# Patient Record
Sex: Male | Born: 1955 | Race: White | Hispanic: No | Smoking: Former smoker
Health system: Southern US, Community
[De-identification: ages and names within clinical notes are randomized; demographics above are authoritative.]

## PROBLEM LIST (undated history)

## (undated) DIAGNOSIS — F32A Depression, unspecified: Secondary | ICD-10-CM

## (undated) DIAGNOSIS — K219 Gastro-esophageal reflux disease without esophagitis: Secondary | ICD-10-CM

## (undated) DIAGNOSIS — H409 Unspecified glaucoma: Secondary | ICD-10-CM

## (undated) DIAGNOSIS — F419 Anxiety disorder, unspecified: Secondary | ICD-10-CM

## (undated) DIAGNOSIS — E785 Hyperlipidemia, unspecified: Secondary | ICD-10-CM

## (undated) DIAGNOSIS — F329 Major depressive disorder, single episode, unspecified: Secondary | ICD-10-CM

## (undated) DIAGNOSIS — I10 Essential (primary) hypertension: Secondary | ICD-10-CM

## (undated) HISTORY — DX: Hyperlipidemia, unspecified: E78.5

## (undated) HISTORY — DX: Anxiety disorder, unspecified: F41.9

## (undated) HISTORY — DX: Depression, unspecified: F32.A

## (undated) HISTORY — DX: Unspecified glaucoma: H40.9

## (undated) HISTORY — PX: SPINE SURGERY: SHX786

## (undated) HISTORY — DX: Gastro-esophageal reflux disease without esophagitis: K21.9

## (undated) HISTORY — DX: Essential (primary) hypertension: I10

## (undated) HISTORY — DX: Major depressive disorder, single episode, unspecified: F32.9

## (undated) HISTORY — PX: EYE SURGERY: SHX253

---

## 1998-03-17 ENCOUNTER — Emergency Department (HOSPITAL_COMMUNITY): Admission: EM | Admit: 1998-03-17 | Discharge: 1998-03-17 | Payer: Self-pay | Admitting: Emergency Medicine

## 2006-09-02 ENCOUNTER — Emergency Department (HOSPITAL_COMMUNITY): Admission: EM | Admit: 2006-09-02 | Discharge: 2006-09-02 | Payer: Self-pay | Admitting: Emergency Medicine

## 2008-07-02 IMAGING — CR DG CHEST 2V
2 series · 2 of 2 positions shown · non-contrast
Comparison: None.

CLINICAL DATA: Chest pain and hypertension.
 CHEST ? 2 VIEW:

[view not recorded (1 of 2)]
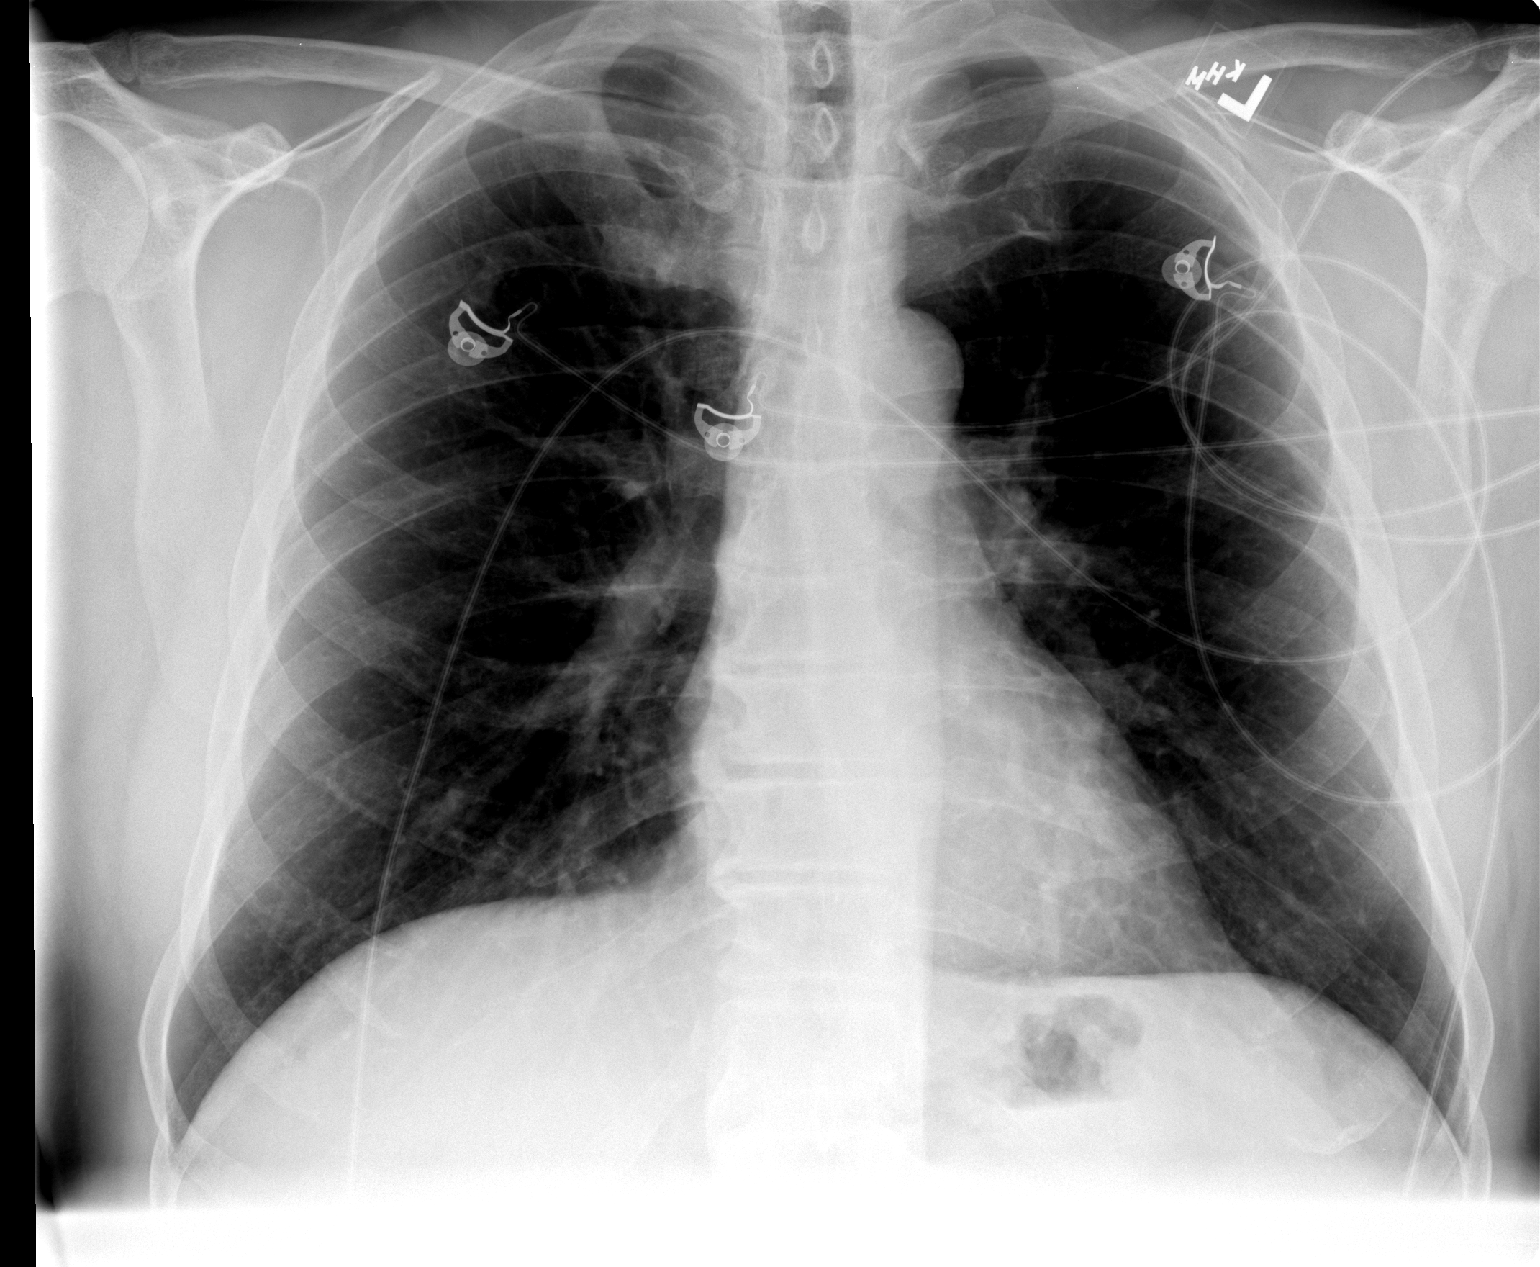

[view not recorded (2 of 2)]
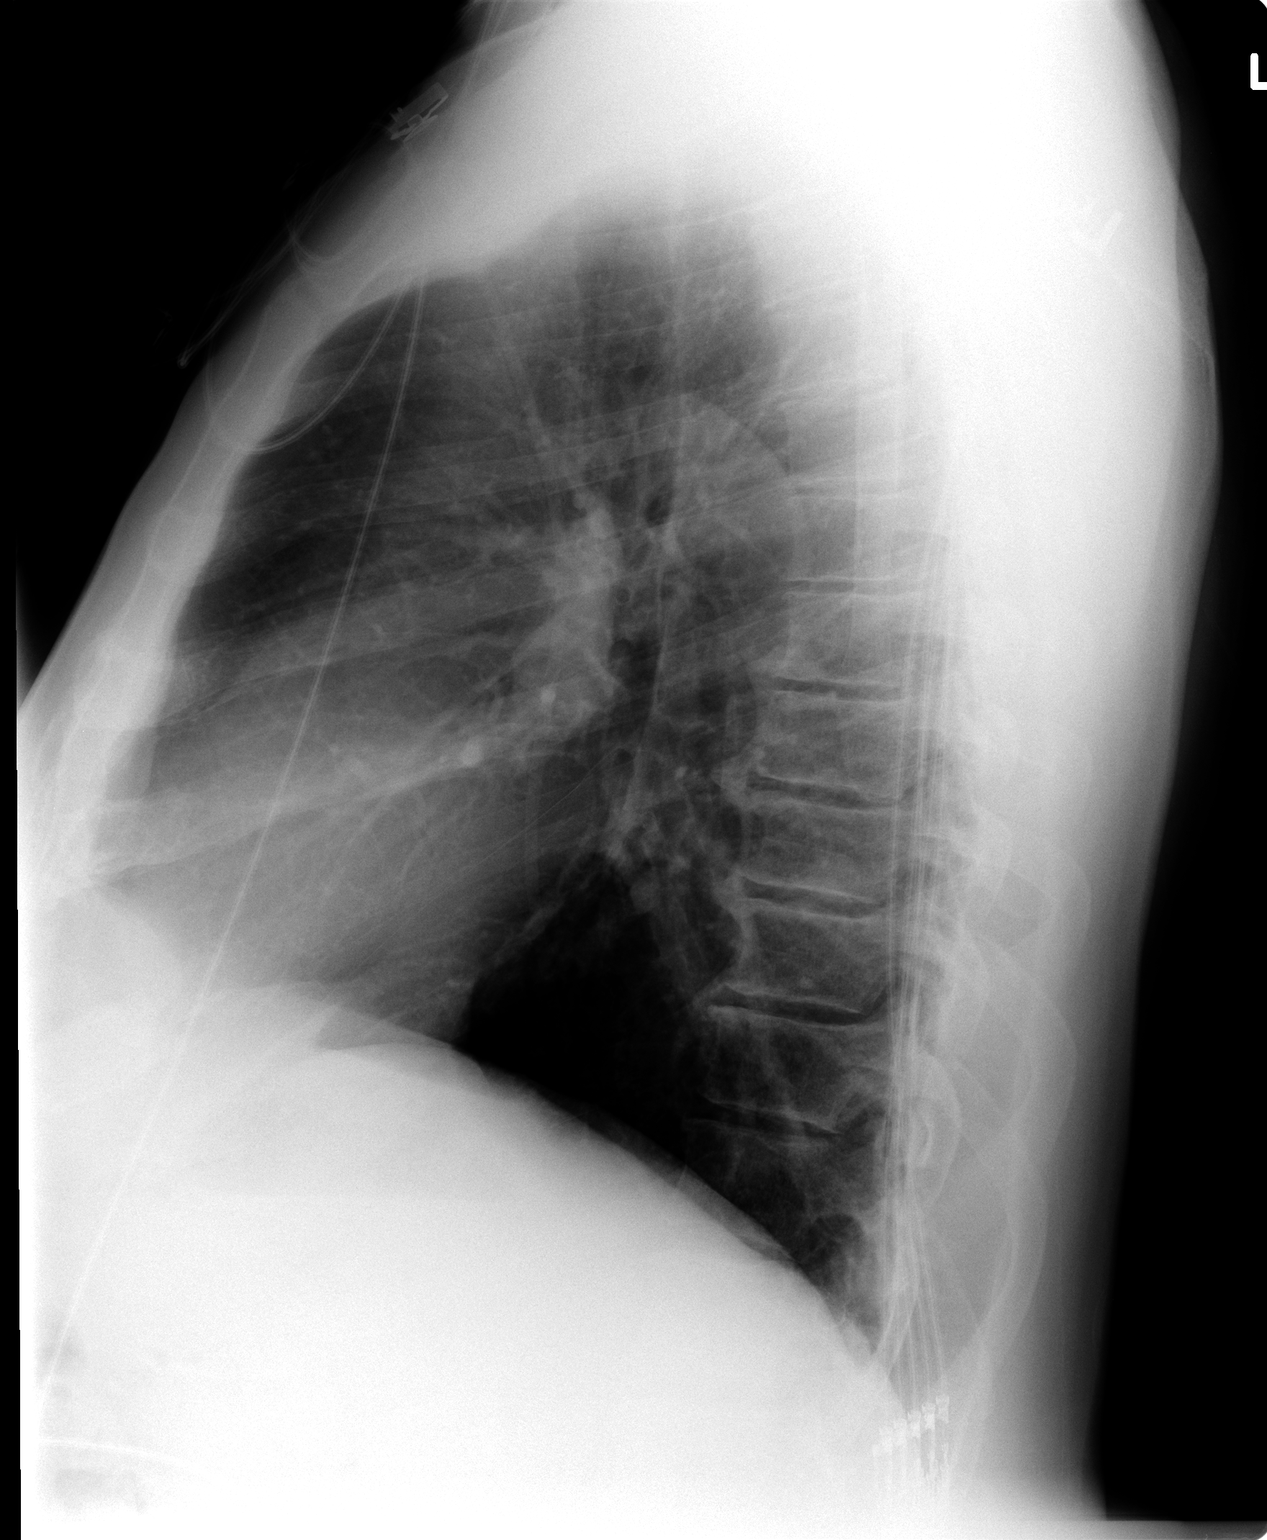

[2 of 2 positions shown; findings below may reference images not displayed]

FINDINGS: Midthoracic spondylosis.  Midline trachea.  Heart size normal.  Mediastinal contours within normal limits.  Costophrenic angles are sharp.  There is no pneumothorax or evidence of congestive failure.  There is density projecting just above and EKG lead below the 5th posterior right rib.  This is likely related to the EKG lead.  The lungs are otherwise clear.
IMPRESSION: 1.  No acute cardiopulmonary disease. 
 2.  Probable artifactual density projecting just above an EKG lead.  Consider followup PA film after removal or reposition of EKG lead to exclude a pulmonary nodule.

## 2015-10-04 DIAGNOSIS — Z139 Encounter for screening, unspecified: Secondary | ICD-10-CM | POA: Diagnosis not present

## 2015-10-04 DIAGNOSIS — Z79899 Other long term (current) drug therapy: Secondary | ICD-10-CM | POA: Diagnosis not present

## 2015-10-04 DIAGNOSIS — F419 Anxiety disorder, unspecified: Secondary | ICD-10-CM | POA: Diagnosis not present

## 2015-10-04 DIAGNOSIS — I10 Essential (primary) hypertension: Secondary | ICD-10-CM | POA: Diagnosis not present

## 2015-10-04 DIAGNOSIS — M25562 Pain in left knee: Secondary | ICD-10-CM | POA: Diagnosis not present

## 2015-10-04 DIAGNOSIS — E785 Hyperlipidemia, unspecified: Secondary | ICD-10-CM | POA: Diagnosis not present

## 2015-10-11 DIAGNOSIS — I10 Essential (primary) hypertension: Secondary | ICD-10-CM | POA: Diagnosis not present

## 2015-10-11 DIAGNOSIS — R7301 Impaired fasting glucose: Secondary | ICD-10-CM | POA: Diagnosis not present

## 2015-10-11 DIAGNOSIS — E785 Hyperlipidemia, unspecified: Secondary | ICD-10-CM | POA: Diagnosis not present

## 2015-10-25 DIAGNOSIS — H2512 Age-related nuclear cataract, left eye: Secondary | ICD-10-CM | POA: Diagnosis not present

## 2015-10-25 DIAGNOSIS — H402223 Chronic angle-closure glaucoma, left eye, severe stage: Secondary | ICD-10-CM | POA: Diagnosis not present

## 2015-10-25 DIAGNOSIS — H25812 Combined forms of age-related cataract, left eye: Secondary | ICD-10-CM | POA: Diagnosis not present

## 2015-10-25 DIAGNOSIS — H401122 Primary open-angle glaucoma, left eye, moderate stage: Secondary | ICD-10-CM | POA: Diagnosis not present

## 2015-11-01 DIAGNOSIS — R799 Abnormal finding of blood chemistry, unspecified: Secondary | ICD-10-CM | POA: Diagnosis not present

## 2015-11-01 DIAGNOSIS — R7301 Impaired fasting glucose: Secondary | ICD-10-CM | POA: Diagnosis not present

## 2015-11-01 DIAGNOSIS — M25562 Pain in left knee: Secondary | ICD-10-CM | POA: Diagnosis not present

## 2015-11-01 DIAGNOSIS — E785 Hyperlipidemia, unspecified: Secondary | ICD-10-CM | POA: Diagnosis not present

## 2015-11-01 DIAGNOSIS — I10 Essential (primary) hypertension: Secondary | ICD-10-CM | POA: Diagnosis not present

## 2015-11-01 DIAGNOSIS — F419 Anxiety disorder, unspecified: Secondary | ICD-10-CM | POA: Diagnosis not present

## 2015-11-08 DIAGNOSIS — I10 Essential (primary) hypertension: Secondary | ICD-10-CM | POA: Diagnosis not present

## 2015-11-08 DIAGNOSIS — E789 Disorder of lipoprotein metabolism, unspecified: Secondary | ICD-10-CM | POA: Diagnosis not present

## 2015-11-08 DIAGNOSIS — F411 Generalized anxiety disorder: Secondary | ICD-10-CM | POA: Diagnosis not present

## 2015-11-15 DIAGNOSIS — H402213 Chronic angle-closure glaucoma, right eye, severe stage: Secondary | ICD-10-CM | POA: Diagnosis not present

## 2015-11-15 DIAGNOSIS — H402223 Chronic angle-closure glaucoma, left eye, severe stage: Secondary | ICD-10-CM | POA: Diagnosis not present

## 2015-11-15 DIAGNOSIS — H2511 Age-related nuclear cataract, right eye: Secondary | ICD-10-CM | POA: Diagnosis not present

## 2015-11-15 DIAGNOSIS — H25811 Combined forms of age-related cataract, right eye: Secondary | ICD-10-CM | POA: Diagnosis not present

## 2015-12-01 DIAGNOSIS — R7301 Impaired fasting glucose: Secondary | ICD-10-CM | POA: Diagnosis not present

## 2015-12-01 DIAGNOSIS — E785 Hyperlipidemia, unspecified: Secondary | ICD-10-CM | POA: Diagnosis not present

## 2015-12-01 DIAGNOSIS — I10 Essential (primary) hypertension: Secondary | ICD-10-CM | POA: Diagnosis not present

## 2015-12-01 DIAGNOSIS — E669 Obesity, unspecified: Secondary | ICD-10-CM | POA: Diagnosis not present

## 2016-03-13 DIAGNOSIS — M25562 Pain in left knee: Secondary | ICD-10-CM | POA: Diagnosis not present

## 2016-03-13 DIAGNOSIS — M25561 Pain in right knee: Secondary | ICD-10-CM | POA: Diagnosis not present

## 2016-03-13 DIAGNOSIS — G8929 Other chronic pain: Secondary | ICD-10-CM | POA: Diagnosis not present

## 2016-03-13 DIAGNOSIS — Z79899 Other long term (current) drug therapy: Secondary | ICD-10-CM | POA: Diagnosis not present

## 2016-03-29 DIAGNOSIS — I10 Essential (primary) hypertension: Secondary | ICD-10-CM | POA: Diagnosis not present

## 2016-03-29 DIAGNOSIS — F419 Anxiety disorder, unspecified: Secondary | ICD-10-CM | POA: Diagnosis not present

## 2016-03-29 DIAGNOSIS — E785 Hyperlipidemia, unspecified: Secondary | ICD-10-CM | POA: Diagnosis not present

## 2016-03-29 DIAGNOSIS — M25562 Pain in left knee: Secondary | ICD-10-CM | POA: Diagnosis not present

## 2016-04-07 DIAGNOSIS — H402212 Chronic angle-closure glaucoma, right eye, moderate stage: Secondary | ICD-10-CM | POA: Diagnosis not present

## 2016-04-07 DIAGNOSIS — H402223 Chronic angle-closure glaucoma, left eye, severe stage: Secondary | ICD-10-CM | POA: Diagnosis not present

## 2016-04-10 DIAGNOSIS — M25562 Pain in left knee: Secondary | ICD-10-CM | POA: Diagnosis not present

## 2016-04-10 DIAGNOSIS — M25561 Pain in right knee: Secondary | ICD-10-CM | POA: Diagnosis not present

## 2016-04-10 DIAGNOSIS — G8929 Other chronic pain: Secondary | ICD-10-CM | POA: Diagnosis not present

## 2016-04-13 DIAGNOSIS — Z23 Encounter for immunization: Secondary | ICD-10-CM | POA: Diagnosis not present

## 2016-05-01 DIAGNOSIS — E785 Hyperlipidemia, unspecified: Secondary | ICD-10-CM | POA: Diagnosis not present

## 2016-05-01 DIAGNOSIS — M25562 Pain in left knee: Secondary | ICD-10-CM | POA: Diagnosis not present

## 2016-05-01 DIAGNOSIS — F419 Anxiety disorder, unspecified: Secondary | ICD-10-CM | POA: Diagnosis not present

## 2016-05-01 DIAGNOSIS — I10 Essential (primary) hypertension: Secondary | ICD-10-CM | POA: Diagnosis not present

## 2016-05-08 DIAGNOSIS — M1712 Unilateral primary osteoarthritis, left knee: Secondary | ICD-10-CM | POA: Diagnosis not present

## 2016-05-10 DIAGNOSIS — M25562 Pain in left knee: Secondary | ICD-10-CM | POA: Diagnosis not present

## 2016-05-10 DIAGNOSIS — M179 Osteoarthritis of knee, unspecified: Secondary | ICD-10-CM | POA: Diagnosis not present

## 2016-05-10 DIAGNOSIS — G8929 Other chronic pain: Secondary | ICD-10-CM | POA: Diagnosis not present

## 2016-05-24 DIAGNOSIS — R7301 Impaired fasting glucose: Secondary | ICD-10-CM | POA: Diagnosis not present

## 2016-05-24 DIAGNOSIS — E785 Hyperlipidemia, unspecified: Secondary | ICD-10-CM | POA: Diagnosis not present

## 2016-05-24 DIAGNOSIS — F419 Anxiety disorder, unspecified: Secondary | ICD-10-CM | POA: Diagnosis not present

## 2016-05-24 DIAGNOSIS — Z79899 Other long term (current) drug therapy: Secondary | ICD-10-CM | POA: Diagnosis not present

## 2016-06-12 DIAGNOSIS — M1712 Unilateral primary osteoarthritis, left knee: Secondary | ICD-10-CM | POA: Diagnosis not present

## 2016-06-12 DIAGNOSIS — M25562 Pain in left knee: Secondary | ICD-10-CM | POA: Diagnosis not present

## 2016-06-19 DIAGNOSIS — M1711 Unilateral primary osteoarthritis, right knee: Secondary | ICD-10-CM | POA: Diagnosis not present

## 2016-07-05 DIAGNOSIS — E785 Hyperlipidemia, unspecified: Secondary | ICD-10-CM | POA: Diagnosis not present

## 2016-07-05 DIAGNOSIS — M25562 Pain in left knee: Secondary | ICD-10-CM | POA: Diagnosis not present

## 2016-07-05 DIAGNOSIS — R7301 Impaired fasting glucose: Secondary | ICD-10-CM | POA: Diagnosis not present

## 2016-07-05 DIAGNOSIS — F419 Anxiety disorder, unspecified: Secondary | ICD-10-CM | POA: Diagnosis not present

## 2016-08-11 DIAGNOSIS — H402212 Chronic angle-closure glaucoma, right eye, moderate stage: Secondary | ICD-10-CM | POA: Diagnosis not present

## 2016-09-04 DIAGNOSIS — F419 Anxiety disorder, unspecified: Secondary | ICD-10-CM | POA: Diagnosis not present

## 2016-09-04 DIAGNOSIS — I10 Essential (primary) hypertension: Secondary | ICD-10-CM | POA: Diagnosis not present

## 2016-09-04 DIAGNOSIS — E785 Hyperlipidemia, unspecified: Secondary | ICD-10-CM | POA: Diagnosis not present

## 2016-09-04 DIAGNOSIS — K219 Gastro-esophageal reflux disease without esophagitis: Secondary | ICD-10-CM | POA: Diagnosis not present

## 2016-09-18 DIAGNOSIS — I10 Essential (primary) hypertension: Secondary | ICD-10-CM | POA: Diagnosis not present

## 2016-09-18 DIAGNOSIS — R110 Nausea: Secondary | ICD-10-CM | POA: Diagnosis not present

## 2016-09-18 DIAGNOSIS — Z79899 Other long term (current) drug therapy: Secondary | ICD-10-CM | POA: Diagnosis not present

## 2016-09-18 DIAGNOSIS — Z139 Encounter for screening, unspecified: Secondary | ICD-10-CM | POA: Diagnosis not present

## 2016-09-18 DIAGNOSIS — F419 Anxiety disorder, unspecified: Secondary | ICD-10-CM | POA: Diagnosis not present

## 2016-10-11 DIAGNOSIS — I10 Essential (primary) hypertension: Secondary | ICD-10-CM | POA: Diagnosis not present

## 2016-10-11 DIAGNOSIS — R110 Nausea: Secondary | ICD-10-CM | POA: Diagnosis not present

## 2016-10-11 DIAGNOSIS — F419 Anxiety disorder, unspecified: Secondary | ICD-10-CM | POA: Diagnosis not present

## 2016-10-11 DIAGNOSIS — K219 Gastro-esophageal reflux disease without esophagitis: Secondary | ICD-10-CM | POA: Diagnosis not present

## 2016-12-04 DIAGNOSIS — F419 Anxiety disorder, unspecified: Secondary | ICD-10-CM | POA: Diagnosis not present

## 2016-12-04 DIAGNOSIS — Z6837 Body mass index (BMI) 37.0-37.9, adult: Secondary | ICD-10-CM | POA: Diagnosis not present

## 2016-12-04 DIAGNOSIS — E785 Hyperlipidemia, unspecified: Secondary | ICD-10-CM | POA: Diagnosis not present

## 2016-12-04 DIAGNOSIS — E669 Obesity, unspecified: Secondary | ICD-10-CM | POA: Diagnosis not present

## 2016-12-04 DIAGNOSIS — Z008 Encounter for other general examination: Secondary | ICD-10-CM | POA: Diagnosis not present

## 2016-12-04 DIAGNOSIS — I10 Essential (primary) hypertension: Secondary | ICD-10-CM | POA: Diagnosis not present

## 2016-12-13 DIAGNOSIS — H402223 Chronic angle-closure glaucoma, left eye, severe stage: Secondary | ICD-10-CM | POA: Diagnosis not present

## 2016-12-13 DIAGNOSIS — H402212 Chronic angle-closure glaucoma, right eye, moderate stage: Secondary | ICD-10-CM | POA: Diagnosis not present

## 2016-12-25 DIAGNOSIS — Z139 Encounter for screening, unspecified: Secondary | ICD-10-CM | POA: Diagnosis not present

## 2016-12-25 DIAGNOSIS — Z79899 Other long term (current) drug therapy: Secondary | ICD-10-CM | POA: Diagnosis not present

## 2016-12-25 DIAGNOSIS — E785 Hyperlipidemia, unspecified: Secondary | ICD-10-CM | POA: Diagnosis not present

## 2017-01-01 ENCOUNTER — Ambulatory Visit (INDEPENDENT_AMBULATORY_CARE_PROVIDER_SITE_OTHER): Payer: BLUE CROSS/BLUE SHIELD | Admitting: Physician Assistant

## 2017-01-01 ENCOUNTER — Encounter: Payer: Self-pay | Admitting: Physician Assistant

## 2017-01-01 VITALS — BP 135/70 | HR 84 | Temp 98.0°F | Ht 71.0 in | Wt 254.4 lb

## 2017-01-01 DIAGNOSIS — M1711 Unilateral primary osteoarthritis, right knee: Secondary | ICD-10-CM | POA: Diagnosis not present

## 2017-01-01 DIAGNOSIS — F419 Anxiety disorder, unspecified: Secondary | ICD-10-CM | POA: Diagnosis not present

## 2017-01-01 DIAGNOSIS — Z719 Counseling, unspecified: Secondary | ICD-10-CM | POA: Diagnosis not present

## 2017-01-01 DIAGNOSIS — Z008 Encounter for other general examination: Secondary | ICD-10-CM | POA: Diagnosis not present

## 2017-01-01 DIAGNOSIS — E7800 Pure hypercholesterolemia, unspecified: Secondary | ICD-10-CM | POA: Diagnosis not present

## 2017-01-01 DIAGNOSIS — H409 Unspecified glaucoma: Secondary | ICD-10-CM

## 2017-01-01 DIAGNOSIS — I10 Essential (primary) hypertension: Secondary | ICD-10-CM | POA: Insufficient documentation

## 2017-01-01 DIAGNOSIS — E785 Hyperlipidemia, unspecified: Secondary | ICD-10-CM | POA: Diagnosis not present

## 2017-01-01 DIAGNOSIS — E669 Obesity, unspecified: Secondary | ICD-10-CM | POA: Diagnosis not present

## 2017-01-01 DIAGNOSIS — Z6836 Body mass index (BMI) 36.0-36.9, adult: Secondary | ICD-10-CM | POA: Diagnosis not present

## 2017-01-01 MED ORDER — ALPRAZOLAM 1 MG PO TABS: 1.0000 mg | ORAL_TABLET | Freq: Two times a day (BID) | ORAL | 5 refills | Status: DC | PRN

## 2017-01-01 NOTE — Patient Instructions (Signed)

## 2017-01-02 ENCOUNTER — Telehealth: Payer: Self-pay | Admitting: Physician Assistant

## 2017-01-02 NOTE — Progress Notes (Signed)
BP 135/70   Pulse 84   Temp 98 F (36.7 C) (Oral)   Ht 5\' 11"  (1.803 m)   Wt 254 lb 6.4 oz (115.4 kg)   BMI 35.48 kg/m    Subjective:    Patient ID: Mark HockeyJerry T Kam, male    DOB: 04/12/1956, 61 y.o.   MRN: [id number]  HPI: Mark Sharp is a 61 y.o. male presenting on 01/01/2017 for Establish Care  This patient comes in To be established with our clinic and for periodic recheck on medications and conditions including osteoarthritis, hypertension anxiety, hyperlipidemia. He also was diagnosed with glaucoma. He had surgery by Dr. Jimmie MollyBing and is doing very well. There were a couple very small cataracts and this was repaired during the surgery time. He has no other significant complaints. His knee bothers him significantly at times. We have discussed getting an injection in it and he is willing to go back and see his orthopedist.  All medications are reviewed today. There are no reports of any problems with the medications. All of the medical conditions are reviewed and updated.  Lab work is reviewed and will be ordered as medically necessary. There are no new problems reported with today's visit.   Relevant past medical, surgical, family and social history reviewed and updated as indicated. Allergies and medications reviewed and updated.  Past Medical History:  Diagnosis Date  . Anxiety   . Depression   . GERD (gastroesophageal reflux disease)   . Glaucoma   . Hyperlipidemia   . Hypertension     Past Surgical History:  Procedure Laterality Date  . EYE SURGERY    . SPINE SURGERY      Review of Systems  Constitutional: Negative.  Negative for appetite change and fatigue.  HENT: Negative.   Eyes: Negative.  Negative for pain and visual disturbance.  Respiratory: Negative.  Negative for cough, chest tightness, shortness of breath and wheezing.   Cardiovascular: Negative.  Negative for chest pain, palpitations and leg swelling.  Gastrointestinal: Negative.  Negative for abdominal pain,  diarrhea, nausea and vomiting.  Endocrine: Negative.   Genitourinary: Negative.   Musculoskeletal: Positive for arthralgias, gait problem and joint swelling.  Skin: Negative.  Negative for color change and rash.  Neurological: Negative for weakness, numbness and headaches.  Psychiatric/Behavioral: Negative.     Allergies as of 01/01/2017      Reactions   Penicillins Rash      Medication List       Accurate as of 01/01/17 11:59 PM. Always use your most recent med list.          ALPRAZolam 1 MG tablet Commonly known as:  XANAX Take 1 tablet (1 mg total) by mouth 2 (two) times daily as needed for anxiety.   citalopram 20 MG tablet Commonly known as:  CELEXA Take 20 mg by mouth daily.   hydrochlorothiazide 25 MG tablet Commonly known as:  HYDRODIURIL Take 25 mg by mouth daily.   pravastatin 40 MG tablet Commonly known as:  PRAVACHOL   ranitidine 150 MG capsule Commonly known as:  ZANTAC Take 150 mg by mouth 2 (two) times daily.   verapamil 180 MG CR tablet Commonly known as:  CALAN-SR Take 180 mg by mouth 3 (three) times daily.          Objective:    BP 135/70   Pulse 84   Temp 98 F (36.7 C) (Oral)   Ht 5\' 11"  (1.803 m)   Wt 254 lb 6.4  oz (115.4 kg)   BMI 35.48 kg/m   Allergies  Allergen Reactions  . Penicillins Rash    Physical Exam  Constitutional: He appears well-developed and well-nourished.  HENT:  Head: Normocephalic and atraumatic.  Eyes: Conjunctivae and EOM are normal. Pupils are equal, round, and reactive to light.  Neck: Normal range of motion. Neck supple.  Cardiovascular: Normal rate, regular rhythm and normal heart sounds.   Pulmonary/Chest: Effort normal and breath sounds normal.  Abdominal: Soft. Bowel sounds are normal.  Musculoskeletal: Normal range of motion. He exhibits edema and deformity.  Skin: Skin is warm and dry.  Nursing note and vitals reviewed.   No results found for this or any previous visit.    Assessment & Plan:    1. Primary osteoarthritis of right knee  2. Essential hypertension - hydrochlorothiazide (HYDRODIURIL) 25 MG tablet; Take 25 mg by mouth daily.  - verapamil (CALAN-SR) 180 MG CR tablet; Take 180 mg by mouth 3 (three) times daily.   3. Anxiety - citalopram (CELEXA) 20 MG tablet; Take 20 mg by mouth daily.  - ALPRAZolam (XANAX) 1 MG tablet; Take 1 tablet (1 mg total) by mouth 2 (two) times daily as needed for anxiety.  Dispense: 60 tablet; Refill: 5  4. Pure hypercholesterolemia - pravastatin (PRAVACHOL) 40 MG tablet;   5. Glaucoma of both eyes, unspecified glaucoma type   Current Outpatient Prescriptions:  .  citalopram (CELEXA) 20 MG tablet, Take 20 mg by mouth daily. , Disp: , Rfl:  .  hydrochlorothiazide (HYDRODIURIL) 25 MG tablet, Take 25 mg by mouth daily. , Disp: , Rfl:  .  pravastatin (PRAVACHOL) 40 MG tablet, , Disp: , Rfl:  .  ranitidine (ZANTAC) 150 MG capsule, Take 150 mg by mouth 2 (two) times daily., Disp: , Rfl:  .  verapamil (CALAN-SR) 180 MG CR tablet, Take 180 mg by mouth 3 (three) times daily. , Disp: , Rfl:  .  ALPRAZolam (XANAX) 1 MG tablet, Take 1 tablet (1 mg total) by mouth 2 (two) times daily as needed for anxiety., Disp: 60 tablet, Rfl: 5  Continue all other maintenance medications as listed above.  Follow up plan: Return in about 6 months (around 07/04/2017) for recheck.  Educational handout given for health maintenance  Remus Loffler PA-C [facility] [address]  [place], [place] [address] [phone]   01/02/2017, 12:56 PM

## 2017-01-18 ENCOUNTER — Encounter: Payer: Self-pay | Admitting: *Deleted

## 2017-03-19 DIAGNOSIS — Z139 Encounter for screening, unspecified: Secondary | ICD-10-CM | POA: Diagnosis not present

## 2017-03-19 DIAGNOSIS — E785 Hyperlipidemia, unspecified: Secondary | ICD-10-CM | POA: Diagnosis not present

## 2017-03-19 DIAGNOSIS — Z79899 Other long term (current) drug therapy: Secondary | ICD-10-CM | POA: Diagnosis not present

## 2017-03-20 DIAGNOSIS — H402212 Chronic angle-closure glaucoma, right eye, moderate stage: Secondary | ICD-10-CM | POA: Diagnosis not present

## 2017-03-20 DIAGNOSIS — H402223 Chronic angle-closure glaucoma, left eye, severe stage: Secondary | ICD-10-CM | POA: Diagnosis not present

## 2017-04-09 DIAGNOSIS — Z6836 Body mass index (BMI) 36.0-36.9, adult: Secondary | ICD-10-CM | POA: Diagnosis not present

## 2017-04-09 DIAGNOSIS — Z008 Encounter for other general examination: Secondary | ICD-10-CM | POA: Diagnosis not present

## 2017-04-09 DIAGNOSIS — E785 Hyperlipidemia, unspecified: Secondary | ICD-10-CM | POA: Diagnosis not present

## 2017-04-09 DIAGNOSIS — F419 Anxiety disorder, unspecified: Secondary | ICD-10-CM | POA: Diagnosis not present

## 2017-04-09 DIAGNOSIS — E669 Obesity, unspecified: Secondary | ICD-10-CM | POA: Diagnosis not present

## 2017-04-09 DIAGNOSIS — I10 Essential (primary) hypertension: Secondary | ICD-10-CM | POA: Diagnosis not present

## 2017-07-04 ENCOUNTER — Ambulatory Visit: Payer: BLUE CROSS/BLUE SHIELD | Admitting: Physician Assistant

## 2017-07-05 ENCOUNTER — Ambulatory Visit: Payer: BLUE CROSS/BLUE SHIELD | Admitting: Physician Assistant

## 2017-07-23 DIAGNOSIS — H402223 Chronic angle-closure glaucoma, left eye, severe stage: Secondary | ICD-10-CM | POA: Diagnosis not present

## 2017-08-20 DIAGNOSIS — H402223 Chronic angle-closure glaucoma, left eye, severe stage: Secondary | ICD-10-CM | POA: Diagnosis not present

## 2017-09-12 DIAGNOSIS — Z139 Encounter for screening, unspecified: Secondary | ICD-10-CM | POA: Diagnosis not present

## 2017-09-12 DIAGNOSIS — R7301 Impaired fasting glucose: Secondary | ICD-10-CM | POA: Diagnosis not present

## 2017-09-12 DIAGNOSIS — Z79899 Other long term (current) drug therapy: Secondary | ICD-10-CM | POA: Diagnosis not present

## 2017-09-12 DIAGNOSIS — E785 Hyperlipidemia, unspecified: Secondary | ICD-10-CM | POA: Diagnosis not present

## 2017-09-26 DIAGNOSIS — E785 Hyperlipidemia, unspecified: Secondary | ICD-10-CM | POA: Diagnosis not present

## 2017-09-26 DIAGNOSIS — I10 Essential (primary) hypertension: Secondary | ICD-10-CM | POA: Diagnosis not present

## 2017-09-26 DIAGNOSIS — F419 Anxiety disorder, unspecified: Secondary | ICD-10-CM | POA: Diagnosis not present

## 2017-09-26 DIAGNOSIS — Z008 Encounter for other general examination: Secondary | ICD-10-CM | POA: Diagnosis not present

## 2017-10-25 ENCOUNTER — Ambulatory Visit: Payer: BLUE CROSS/BLUE SHIELD | Admitting: Physician Assistant

## 2017-10-25 ENCOUNTER — Encounter: Payer: Self-pay | Admitting: Physician Assistant

## 2017-10-25 VITALS — BP 144/89 | HR 94 | Ht 71.0 in | Wt 264.0 lb

## 2017-10-25 DIAGNOSIS — M1711 Unilateral primary osteoarthritis, right knee: Secondary | ICD-10-CM

## 2017-10-25 DIAGNOSIS — K219 Gastro-esophageal reflux disease without esophagitis: Secondary | ICD-10-CM

## 2017-10-25 DIAGNOSIS — F419 Anxiety disorder, unspecified: Secondary | ICD-10-CM

## 2017-10-25 DIAGNOSIS — I10 Essential (primary) hypertension: Secondary | ICD-10-CM

## 2017-10-25 DIAGNOSIS — E7800 Pure hypercholesterolemia, unspecified: Secondary | ICD-10-CM

## 2017-10-25 MED ORDER — HYDROCHLOROTHIAZIDE 25 MG PO TABS: 25.0000 mg | ORAL_TABLET | Freq: Every day | ORAL | 3 refills | Status: DC

## 2017-10-25 MED ORDER — VERAPAMIL HCL ER 180 MG PO TBCR: 180.0000 mg | EXTENDED_RELEASE_TABLET | Freq: Three times a day (TID) | ORAL | 3 refills | Status: DC

## 2017-10-25 MED ORDER — PANTOPRAZOLE SODIUM 20 MG PO TBEC: 20.0000 mg | DELAYED_RELEASE_TABLET | Freq: Every day | ORAL | 3 refills | Status: DC

## 2017-10-25 MED ORDER — PRAVASTATIN SODIUM 40 MG PO TABS: 40.0000 mg | ORAL_TABLET | Freq: Every day | ORAL | 3 refills | Status: DC

## 2017-10-25 MED ORDER — CITALOPRAM HYDROBROMIDE 20 MG PO TABS
20.0000 mg | ORAL_TABLET | Freq: Every day | ORAL | 3 refills | Status: DC
Start: 2017-10-25 — End: 2020-01-21

## 2017-10-25 MED ORDER — DICLOFENAC SODIUM 3 % TD GEL: 1.0000 "application " | Freq: Four times a day (QID) | TRANSDERMAL | 3 refills | Status: DC

## 2017-10-25 MED ORDER — ALPRAZOLAM 1 MG PO TABS: 1.0000 mg | ORAL_TABLET | Freq: Two times a day (BID) | ORAL | 5 refills | Status: DC | PRN

## 2017-10-26 DIAGNOSIS — K219 Gastro-esophageal reflux disease without esophagitis: Secondary | ICD-10-CM | POA: Insufficient documentation

## 2017-10-26 NOTE — Progress Notes (Signed)
BP (!) 144/89   Pulse 94   Ht 5\' 11"  (1.803 m)   Wt 264 lb (119.7 kg)   BMI 36.82 kg/m    Subjective:    Patient ID: Mark Sharp, male    DOB: [birth year], 62 y.o.   MRN: [id number]  HPI: Mark Sharp is a 62 y.o. male presenting on 10/25/2017 for Follow-up and Medication recheck  This patient comes in for a six-month checkup on his medical conditions.  He does see a Advice worker through his work place.  Labs have been sent over.  He did have slight elevation in his triglycerides and glucose.  He is going to work hard on reducing carbs in his diet and plan to have this rechecked at work.  We will recheck him in 6 months.  He does need refills on his medications sent to the mail in pharmacy.  He has no other complaints at this time.  Past Medical History:  Diagnosis Date  . Anxiety   . Depression   . GERD (gastroesophageal reflux disease)   . Glaucoma   . Hyperlipidemia   . Hypertension    Relevant past medical, surgical, family and social history reviewed and updated as indicated. Interim medical history since our last visit reviewed. Allergies and medications reviewed and updated. DATA REVIEWED: CHART IN EPIC  Family History reviewed for pertinent findings.  Review of Systems  Constitutional: Negative.  Negative for appetite change and fatigue.  Eyes: Negative for pain and visual disturbance.  Respiratory: Negative.  Negative for cough, chest tightness, shortness of breath and wheezing.   Cardiovascular: Negative.  Negative for chest pain, palpitations and leg swelling.  Gastrointestinal: Negative.  Negative for abdominal pain, diarrhea, nausea and vomiting.  Genitourinary: Negative.   Musculoskeletal: Positive for arthralgias and joint swelling.  Skin: Negative.  Negative for color change and rash.  Neurological: Negative.  Negative for weakness, numbness and headaches.  Psychiatric/Behavioral: Negative.     Allergies as of 10/25/2017      Reactions   Penicillins Rash      Medication List        Accurate as of 10/25/17 11:59 PM. Always use your most recent med list.          ALPRAZolam 1 MG tablet Commonly known as:  XANAX Take 1 tablet (1 mg total) by mouth 2 (two) times daily as needed for anxiety.   citalopram 20 MG tablet Commonly known as:  CELEXA Take 1 tablet (20 mg total) by mouth daily.   Diclofenac Sodium 3 % Gel Place 1 application onto the skin 4 (four) times daily.   hydrochlorothiazide 25 MG tablet Commonly known as:  HYDRODIURIL Take 1 tablet (25 mg total) by mouth daily.   pantoprazole 20 MG tablet Commonly known as:  PROTONIX Take 1 tablet (20 mg total) by mouth daily.   pravastatin 40 MG tablet Commonly known as:  PRAVACHOL Take 1 tablet (40 mg total) by mouth daily.   verapamil 180 MG CR tablet Commonly known as:  CALAN-SR Take 1 tablet (180 mg total) by mouth 3 (three) times daily.          Objective:    BP (!) 144/89   Pulse 94   Ht 5\' 11"  (1.803 m)   Wt 264 lb (119.7 kg)   BMI 36.82 kg/m   Allergies  Allergen Reactions  . Penicillins Rash    Wt Readings from Last 3 Encounters:  10/25/17 264 lb (119.7 kg)  01/01/17 254 lb 6.4 oz (115.4 kg)    Physical Exam  Constitutional: He appears well-developed and well-nourished. No distress.  HENT:  Head: Normocephalic and atraumatic.  Eyes: Pupils are equal, round, and reactive to light. Conjunctivae and EOM are normal.  Cardiovascular: Normal rate, regular rhythm and normal heart sounds.  Pulmonary/Chest: Effort normal and breath sounds normal. No respiratory distress.  Skin: Skin is warm and dry.  Psychiatric: He has a normal mood and affect. His behavior is normal.  Nursing note and vitals reviewed.   No results found for this or any previous visit.    Assessment & Plan:   1. Gastroesophageal reflux disease without esophagitis - pantoprazole (PROTONIX) 20 MG tablet; Take 1 tablet (20 mg total) by mouth daily.  Dispense: 90  tablet; Refill: 3  2. Anxiety - ALPRAZolam (XANAX) 1 MG tablet; Take 1 tablet (1 mg total) by mouth 2 (two) times daily as needed for anxiety.  Dispense: 60 tablet; Refill: 5 - citalopram (CELEXA) 20 MG tablet; Take 1 tablet (20 mg total) by mouth daily.  Dispense: 90 tablet; Refill: 3  3. Primary osteoarthritis of right knee - Diclofenac Sodium 3 % GEL; Place 1 application onto the skin 4 (four) times daily.  Dispense: 1200 g; Refill: 3  4. Essential hypertension - hydrochlorothiazide (HYDRODIURIL) 25 MG tablet; Take 1 tablet (25 mg total) by mouth daily.  Dispense: 90 tablet; Refill: 3 - verapamil (CALAN-SR) 180 MG CR tablet; Take 1 tablet (180 mg total) by mouth 3 (three) times daily.  Dispense: 270 tablet; Refill: 3  5. Pure hypercholesterolemia - pravastatin (PRAVACHOL) 40 MG tablet; Take 1 tablet (40 mg total) by mouth daily.  Dispense: 90 tablet; Refill: 3   Continue all other maintenance medications as listed above.  Follow up plan: No follow-ups on file.  Educational handout given for survey  Remus LofflerAngel S. Cathie Bonnell PA-C [facility] [address]  [place], [place] [id number] [phone]   10/26/2017, 1:12 PM

## 2017-11-19 DIAGNOSIS — E785 Hyperlipidemia, unspecified: Secondary | ICD-10-CM | POA: Diagnosis not present

## 2017-11-19 DIAGNOSIS — I10 Essential (primary) hypertension: Secondary | ICD-10-CM | POA: Diagnosis not present

## 2017-11-19 DIAGNOSIS — Z008 Encounter for other general examination: Secondary | ICD-10-CM | POA: Diagnosis not present

## 2017-11-19 DIAGNOSIS — F419 Anxiety disorder, unspecified: Secondary | ICD-10-CM | POA: Diagnosis not present

## 2017-12-25 ENCOUNTER — Encounter: Payer: Self-pay | Admitting: *Deleted

## 2018-01-07 DIAGNOSIS — Z6835 Body mass index (BMI) 35.0-35.9, adult: Secondary | ICD-10-CM | POA: Diagnosis not present

## 2018-01-07 DIAGNOSIS — M170 Bilateral primary osteoarthritis of knee: Secondary | ICD-10-CM | POA: Diagnosis not present

## 2018-02-04 DIAGNOSIS — H402212 Chronic angle-closure glaucoma, right eye, moderate stage: Secondary | ICD-10-CM | POA: Diagnosis not present

## 2018-02-25 DIAGNOSIS — Z008 Encounter for other general examination: Secondary | ICD-10-CM | POA: Diagnosis not present

## 2018-02-25 DIAGNOSIS — Z719 Counseling, unspecified: Secondary | ICD-10-CM | POA: Diagnosis not present

## 2018-02-25 DIAGNOSIS — I10 Essential (primary) hypertension: Secondary | ICD-10-CM | POA: Diagnosis not present

## 2018-02-25 DIAGNOSIS — E785 Hyperlipidemia, unspecified: Secondary | ICD-10-CM | POA: Diagnosis not present

## 2018-03-11 ENCOUNTER — Encounter: Payer: Self-pay | Admitting: Physician Assistant

## 2018-03-11 DIAGNOSIS — E785 Hyperlipidemia, unspecified: Secondary | ICD-10-CM | POA: Diagnosis not present

## 2018-03-11 DIAGNOSIS — Z0130 Encounter for examination of blood pressure without abnormal findings: Secondary | ICD-10-CM | POA: Diagnosis not present

## 2018-03-11 DIAGNOSIS — Z79899 Other long term (current) drug therapy: Secondary | ICD-10-CM | POA: Diagnosis not present

## 2018-03-11 DIAGNOSIS — Z139 Encounter for screening, unspecified: Secondary | ICD-10-CM | POA: Diagnosis not present

## 2018-03-11 DIAGNOSIS — R7301 Impaired fasting glucose: Secondary | ICD-10-CM | POA: Diagnosis not present

## 2018-03-25 DIAGNOSIS — I10 Essential (primary) hypertension: Secondary | ICD-10-CM | POA: Diagnosis not present

## 2018-03-25 DIAGNOSIS — R7982 Elevated C-reactive protein (CRP): Secondary | ICD-10-CM | POA: Diagnosis not present

## 2018-03-25 DIAGNOSIS — E785 Hyperlipidemia, unspecified: Secondary | ICD-10-CM | POA: Diagnosis not present

## 2018-03-25 DIAGNOSIS — R7301 Impaired fasting glucose: Secondary | ICD-10-CM | POA: Diagnosis not present

## 2018-03-26 DIAGNOSIS — Z23 Encounter for immunization: Secondary | ICD-10-CM | POA: Diagnosis not present

## 2018-04-22 DIAGNOSIS — I10 Essential (primary) hypertension: Secondary | ICD-10-CM | POA: Diagnosis not present

## 2018-04-22 DIAGNOSIS — Z008 Encounter for other general examination: Secondary | ICD-10-CM | POA: Diagnosis not present

## 2018-04-22 DIAGNOSIS — E785 Hyperlipidemia, unspecified: Secondary | ICD-10-CM | POA: Diagnosis not present

## 2018-04-22 DIAGNOSIS — Z719 Counseling, unspecified: Secondary | ICD-10-CM | POA: Diagnosis not present

## 2018-04-26 ENCOUNTER — Ambulatory Visit: Payer: BLUE CROSS/BLUE SHIELD | Admitting: Physician Assistant

## 2018-04-29 ENCOUNTER — Ambulatory Visit: Payer: BLUE CROSS/BLUE SHIELD | Admitting: Physician Assistant

## 2018-04-29 ENCOUNTER — Encounter: Payer: Self-pay | Admitting: Physician Assistant

## 2018-04-29 VITALS — BP 133/88 | HR 79 | Temp 97.7°F | Ht 71.0 in | Wt 264.0 lb

## 2018-04-29 DIAGNOSIS — M170 Bilateral primary osteoarthritis of knee: Secondary | ICD-10-CM | POA: Diagnosis not present

## 2018-04-29 DIAGNOSIS — I10 Essential (primary) hypertension: Secondary | ICD-10-CM | POA: Diagnosis not present

## 2018-04-29 DIAGNOSIS — F419 Anxiety disorder, unspecified: Secondary | ICD-10-CM | POA: Diagnosis not present

## 2018-04-29 MED ORDER — ALPRAZOLAM 1 MG PO TABS: 1.0000 mg | ORAL_TABLET | Freq: Three times a day (TID) | ORAL | 5 refills | Status: DC | PRN

## 2018-04-30 DIAGNOSIS — M170 Bilateral primary osteoarthritis of knee: Secondary | ICD-10-CM | POA: Insufficient documentation

## 2018-04-30 NOTE — Progress Notes (Signed)
BP 133/88   Pulse 79   Temp 97.7 F (36.5 C) (Oral)   Ht 5\' 11"  (1.803 m)   Wt 264 lb (119.7 kg)   BMI 36.82 kg/m    Subjective:    Patient ID: Mark Sharp, male    DOB: 1956/01/07, 62 y.o.   MRN: [id number]  HPI: DANFORD TAT is a 62 y.o. male presenting on 04/29/2018 for Hypertension (6 month ) and Anxiety  This patient comes in for periodic recheck on medications and conditions including hypertension, OA and anxiety. 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 He has had good results, labs are reviewed. Medications are stable. NEW narcotic contract is reviewed and signed at today's visit. .   All medications are reviewed today. There are no reports of any problems with the medications. All of the medical conditions are reviewed and updated.  Lab work is reviewed and will be ordered as medically necessary. There are no new problems reported with today's visit.   Past Medical History:  Diagnosis Date  . Anxiety   . Depression   . GERD (gastroesophageal reflux disease)   . Glaucoma   . Hyperlipidemia   . Hypertension    Relevant past medical, surgical, family and social history reviewed and updated as indicated. Interim medical history since our last visit reviewed. Allergies and medications reviewed and updated. DATA REVIEWED: CHART IN EPIC  Family History reviewed for pertinent findings.  Review of Systems  Constitutional: Negative.  Negative for appetite change, fatigue and fever.  Eyes: Negative for pain and visual disturbance.  Respiratory: Negative.  Negative for cough, chest tightness, shortness of breath and wheezing.   Cardiovascular: Negative.  Negative for chest pain, palpitations and leg swelling.  Gastrointestinal: Negative.  Negative for abdominal pain, diarrhea, nausea and vomiting.  Genitourinary: Negative.   Musculoskeletal: Positive for arthralgias, gait problem and joint swelling.  Skin: Negative.  Negative for color  change and rash.  Neurological: Negative for weakness, numbness and headaches.  Psychiatric/Behavioral: Negative.     Allergies as of 04/29/2018      Reactions   Penicillins Rash      Medication List        Accurate as of 04/29/18 11:59 PM. Always use your most recent med list.          ALPRAZolam 1 MG tablet Commonly known as:  XANAX Take 1 tablet (1 mg total) by mouth 3 (three) times daily as needed for anxiety.   citalopram 20 MG tablet Commonly known as:  CELEXA Take 1 tablet (20 mg total) by mouth daily.   hydrochlorothiazide 25 MG tablet Commonly known as:  HYDRODIURIL Take 1 tablet (25 mg total) by mouth daily.   latanoprost 0.005 % ophthalmic solution Commonly known as:  XALATAN   pantoprazole 20 MG tablet Commonly known as:  PROTONIX Take 1 tablet (20 mg total) by mouth daily.   pravastatin 40 MG tablet Commonly known as:  PRAVACHOL Take 1 tablet (40 mg total) by mouth daily.   verapamil 180 MG CR tablet Commonly known as:  CALAN-SR Take 1 tablet (180 mg total) by mouth 3 (three) times daily.          Objective:    BP 133/88   Pulse 79   Temp 97.7 F (36.5 C) (Oral)   Ht 5\' 11"  (1.803 m)   Wt 264 lb (119.7 kg)   BMI 36.82 kg/m   Allergies  Allergen Reactions  .  Penicillins Rash    Wt Readings from Last 3 Encounters:  04/29/18 264 lb (119.7 kg)  10/25/17 264 lb (119.7 kg)  01/01/17 254 lb 6.4 oz (115.4 kg)    Physical Exam  Constitutional: He appears well-developed and well-nourished. No distress.  HENT:  Head: Normocephalic and atraumatic.  Eyes: Pupils are equal, round, and reactive to light. Conjunctivae and EOM are normal.  Cardiovascular: Normal rate, regular rhythm and normal heart sounds.  Pulmonary/Chest: Effort normal and breath sounds normal. No respiratory distress.  Skin: Skin is warm and dry.  Psychiatric: He has a normal mood and affect. His behavior is normal.  Nursing note and vitals reviewed.   No results found  for this or any previous visit.    Assessment & Plan:   1. Anxiety - ALPRAZolam (XANAX) 1 MG tablet; Take 1 tablet (1 mg total) by mouth 3 (three) times daily as needed for anxiety.  Dispense: 80 tablet; Refill: 5  2. Primary osteoarthritis of both knees Continue medication  3. Essential hypertension Continue medication  Continue all other maintenance medications as listed above.  Follow up plan: Return in about 6 months (around 10/29/2018) for recheck.  Educational handout given for survey  Remus Loffler PA-C [facility] [address]  [place], [place] [address] [phone]   04/30/2018, 7:27 PM

## 2018-05-27 DIAGNOSIS — E669 Obesity, unspecified: Secondary | ICD-10-CM | POA: Diagnosis not present

## 2018-05-27 DIAGNOSIS — K219 Gastro-esophageal reflux disease without esophagitis: Secondary | ICD-10-CM | POA: Diagnosis not present

## 2018-05-27 DIAGNOSIS — I10 Essential (primary) hypertension: Secondary | ICD-10-CM | POA: Diagnosis not present

## 2018-05-27 DIAGNOSIS — E785 Hyperlipidemia, unspecified: Secondary | ICD-10-CM | POA: Diagnosis not present

## 2018-06-05 DIAGNOSIS — Z6835 Body mass index (BMI) 35.0-35.9, adult: Secondary | ICD-10-CM | POA: Diagnosis not present

## 2018-06-05 DIAGNOSIS — M170 Bilateral primary osteoarthritis of knee: Secondary | ICD-10-CM | POA: Diagnosis not present

## 2018-06-17 ENCOUNTER — Other Ambulatory Visit: Payer: Self-pay | Admitting: Physician Assistant

## 2018-06-17 DIAGNOSIS — F419 Anxiety disorder, unspecified: Secondary | ICD-10-CM

## 2018-06-17 DIAGNOSIS — E669 Obesity, unspecified: Secondary | ICD-10-CM | POA: Diagnosis not present

## 2018-06-17 DIAGNOSIS — Z7689 Persons encountering health services in other specified circumstances: Secondary | ICD-10-CM | POA: Diagnosis not present

## 2018-07-01 ENCOUNTER — Other Ambulatory Visit: Payer: Self-pay | Admitting: Physician Assistant

## 2018-07-01 DIAGNOSIS — F419 Anxiety disorder, unspecified: Secondary | ICD-10-CM

## 2018-07-01 MED ORDER — ALPRAZOLAM 1 MG PO TABS: 1.0000 mg | ORAL_TABLET | Freq: Three times a day (TID) | ORAL | 5 refills | Status: DC | PRN

## 2018-07-01 NOTE — Telephone Encounter (Signed)
Last seen 04/29/18  Lawanna KobusAngel

## 2018-07-01 NOTE — Telephone Encounter (Signed)
Prescription for #80 w/ 5RF given in October.  Please check with patient.  He should not be out of medications.

## 2018-07-08 DIAGNOSIS — R21 Rash and other nonspecific skin eruption: Secondary | ICD-10-CM | POA: Diagnosis not present

## 2018-07-17 DIAGNOSIS — E785 Hyperlipidemia, unspecified: Secondary | ICD-10-CM | POA: Diagnosis not present

## 2018-07-17 DIAGNOSIS — R7982 Elevated C-reactive protein (CRP): Secondary | ICD-10-CM | POA: Diagnosis not present

## 2018-07-17 DIAGNOSIS — Z008 Encounter for other general examination: Secondary | ICD-10-CM | POA: Diagnosis not present

## 2018-07-17 DIAGNOSIS — I10 Essential (primary) hypertension: Secondary | ICD-10-CM | POA: Diagnosis not present

## 2018-08-09 DIAGNOSIS — H402223 Chronic angle-closure glaucoma, left eye, severe stage: Secondary | ICD-10-CM | POA: Diagnosis not present

## 2018-08-14 DIAGNOSIS — R21 Rash and other nonspecific skin eruption: Secondary | ICD-10-CM | POA: Diagnosis not present

## 2018-08-14 DIAGNOSIS — Z6838 Body mass index (BMI) 38.0-38.9, adult: Secondary | ICD-10-CM | POA: Diagnosis not present

## 2018-08-14 DIAGNOSIS — E669 Obesity, unspecified: Secondary | ICD-10-CM | POA: Diagnosis not present

## 2018-08-28 DIAGNOSIS — M170 Bilateral primary osteoarthritis of knee: Secondary | ICD-10-CM | POA: Diagnosis not present

## 2018-09-11 DIAGNOSIS — M170 Bilateral primary osteoarthritis of knee: Secondary | ICD-10-CM | POA: Diagnosis not present

## 2018-09-11 DIAGNOSIS — Z6837 Body mass index (BMI) 37.0-37.9, adult: Secondary | ICD-10-CM | POA: Diagnosis not present

## 2018-09-11 DIAGNOSIS — E669 Obesity, unspecified: Secondary | ICD-10-CM | POA: Diagnosis not present

## 2018-09-11 DIAGNOSIS — Z7689 Persons encountering health services in other specified circumstances: Secondary | ICD-10-CM | POA: Diagnosis not present

## 2018-10-29 ENCOUNTER — Ambulatory Visit (INDEPENDENT_AMBULATORY_CARE_PROVIDER_SITE_OTHER): Payer: BLUE CROSS/BLUE SHIELD | Admitting: Physician Assistant

## 2018-10-29 DIAGNOSIS — F419 Anxiety disorder, unspecified: Secondary | ICD-10-CM | POA: Diagnosis not present

## 2018-10-29 MED ORDER — ALPRAZOLAM 1 MG PO TABS: 1.0000 mg | ORAL_TABLET | Freq: Three times a day (TID) | ORAL | 5 refills | Status: DC | PRN

## 2018-10-30 ENCOUNTER — Encounter: Payer: Self-pay | Admitting: Physician Assistant

## 2018-10-30 NOTE — Progress Notes (Signed)
     Telephone visit  Subjective: CC: Anxiety PCP: Remus Loffler, PA-C [facility] Mark Sharp is a 63 y.o. male calls for telephone consult today. Patient provides verbal consent for consult held via phone.  Patient is identified with 2 separate identifiers.  At this time the entire area is on COVID-19 social distancing and stay home orders are in place.  Patient is of higher risk and therefore we are performing this by a virtual method.  Location of patient: Home Location of provider: [facility] Others present for call: No  This is a periodic recheck for the patient's anxiety.  He has been doing very well on the Celexa.  He does see his provider through work because it is a financial benefit.  They do take care of all of his chronic medications and include citalopram, hydrochlorothiazide, pantoprazole, pravastatin, verapamil.  He states he has had good blood pressure readings.  He still has a lot of problems with his arthritis and knee joints.  He is hoping to retire in the coming year or so.  He needs refills on his alprazolam at this time.  The registry is checked and there are no abnormal behaviors.   ROS: Per HPI  Allergies  Allergen Reactions  . Penicillins Rash   Past Medical History:  Diagnosis Date  . Anxiety   . Depression   . GERD (gastroesophageal reflux disease)   . Glaucoma   . Hyperlipidemia   . Hypertension     Current Outpatient Medications:  .  ALPRAZolam (XANAX) 1 MG tablet, Take 1 tablet (1 mg total) by mouth 3 (three) times daily as needed for anxiety., Disp: 90 tablet, Rfl: 5 .  citalopram (CELEXA) 20 MG tablet, Take 1 tablet (20 mg total) by mouth daily., Disp: 90 tablet, Rfl: 3 .  hydrochlorothiazide (HYDRODIURIL) 25 MG tablet, Take 1 tablet (25 mg total) by mouth daily., Disp: 90 tablet, Rfl: 3 .  latanoprost (XALATAN) 0.005 % ophthalmic solution, , Disp: , Rfl:  .  pantoprazole (PROTONIX) 20 MG tablet, Take 1 tablet (20 mg total) by mouth daily., Disp: 90  tablet, Rfl: 3 .  pravastatin (PRAVACHOL) 40 MG tablet, Take 1 tablet (40 mg total) by mouth daily., Disp: 90 tablet, Rfl: 3 .  verapamil (CALAN-SR) 180 MG CR tablet, Take 1 tablet (180 mg total) by mouth 3 (three) times daily., Disp: 270 tablet, Rfl: 3  Assessment/ Plan: 63 y.o. male   1. Anxiety - ALPRAZolam (XANAX) 1 MG tablet; Take 1 tablet (1 mg total) by mouth 3 (three) times daily as needed for anxiety.  Dispense: 90 tablet; Refill: 5   Start time: 3:10 PM End time: 3:14 PM  Meds ordered this encounter  Medications  . ALPRAZolam (XANAX) 1 MG tablet    Sig: Take 1 tablet (1 mg total) by mouth 3 (three) times daily as needed for anxiety.    Dispense:  90 tablet    Refill:  5    Order Specific Question:   Supervising Provider    Answer:   Raliegh Ip [[id number]]    Prudy Feeler PA-C [facility] [phone]

## 2018-11-14 ENCOUNTER — Telehealth: Payer: Self-pay | Admitting: Physician Assistant

## 2018-11-14 NOTE — Telephone Encounter (Signed)
RX went to [facility]

## 2018-12-23 DIAGNOSIS — E669 Obesity, unspecified: Secondary | ICD-10-CM | POA: Diagnosis not present

## 2018-12-23 DIAGNOSIS — Z6837 Body mass index (BMI) 37.0-37.9, adult: Secondary | ICD-10-CM | POA: Diagnosis not present

## 2018-12-23 DIAGNOSIS — Z7689 Persons encountering health services in other specified circumstances: Secondary | ICD-10-CM | POA: Diagnosis not present

## 2019-01-21 DIAGNOSIS — M170 Bilateral primary osteoarthritis of knee: Secondary | ICD-10-CM | POA: Diagnosis not present

## 2019-02-14 DIAGNOSIS — H402212 Chronic angle-closure glaucoma, right eye, moderate stage: Secondary | ICD-10-CM | POA: Diagnosis not present

## 2019-03-05 DIAGNOSIS — R21 Rash and other nonspecific skin eruption: Secondary | ICD-10-CM | POA: Diagnosis not present

## 2019-03-05 DIAGNOSIS — J3089 Other allergic rhinitis: Secondary | ICD-10-CM | POA: Diagnosis not present

## 2019-03-05 DIAGNOSIS — L299 Pruritus, unspecified: Secondary | ICD-10-CM | POA: Diagnosis not present

## 2019-03-05 DIAGNOSIS — J301 Allergic rhinitis due to pollen: Secondary | ICD-10-CM | POA: Diagnosis not present

## 2019-03-17 DIAGNOSIS — E669 Obesity, unspecified: Secondary | ICD-10-CM | POA: Diagnosis not present

## 2019-03-17 DIAGNOSIS — R21 Rash and other nonspecific skin eruption: Secondary | ICD-10-CM | POA: Diagnosis not present

## 2019-03-17 DIAGNOSIS — Z6837 Body mass index (BMI) 37.0-37.9, adult: Secondary | ICD-10-CM | POA: Diagnosis not present

## 2019-04-02 DIAGNOSIS — R7982 Elevated C-reactive protein (CRP): Secondary | ICD-10-CM | POA: Diagnosis not present

## 2019-04-02 DIAGNOSIS — E785 Hyperlipidemia, unspecified: Secondary | ICD-10-CM | POA: Diagnosis not present

## 2019-04-02 DIAGNOSIS — Z139 Encounter for screening, unspecified: Secondary | ICD-10-CM | POA: Diagnosis not present

## 2019-04-02 DIAGNOSIS — Z79899 Other long term (current) drug therapy: Secondary | ICD-10-CM | POA: Diagnosis not present

## 2019-04-02 DIAGNOSIS — I10 Essential (primary) hypertension: Secondary | ICD-10-CM | POA: Diagnosis not present

## 2019-04-02 DIAGNOSIS — R7301 Impaired fasting glucose: Secondary | ICD-10-CM | POA: Diagnosis not present

## 2019-04-09 DIAGNOSIS — R7982 Elevated C-reactive protein (CRP): Secondary | ICD-10-CM | POA: Diagnosis not present

## 2019-04-09 DIAGNOSIS — E785 Hyperlipidemia, unspecified: Secondary | ICD-10-CM | POA: Diagnosis not present

## 2019-04-09 DIAGNOSIS — R7301 Impaired fasting glucose: Secondary | ICD-10-CM | POA: Diagnosis not present

## 2019-04-14 DIAGNOSIS — Z23 Encounter for immunization: Secondary | ICD-10-CM | POA: Diagnosis not present

## 2019-04-28 DIAGNOSIS — M170 Bilateral primary osteoarthritis of knee: Secondary | ICD-10-CM | POA: Diagnosis not present

## 2019-05-12 DIAGNOSIS — Z6837 Body mass index (BMI) 37.0-37.9, adult: Secondary | ICD-10-CM | POA: Diagnosis not present

## 2019-05-12 DIAGNOSIS — E669 Obesity, unspecified: Secondary | ICD-10-CM | POA: Diagnosis not present

## 2019-05-12 DIAGNOSIS — Z7689 Persons encountering health services in other specified circumstances: Secondary | ICD-10-CM | POA: Diagnosis not present

## 2019-06-02 DIAGNOSIS — Z7689 Persons encountering health services in other specified circumstances: Secondary | ICD-10-CM | POA: Diagnosis not present

## 2019-06-02 DIAGNOSIS — E669 Obesity, unspecified: Secondary | ICD-10-CM | POA: Diagnosis not present

## 2019-06-02 DIAGNOSIS — Z6837 Body mass index (BMI) 37.0-37.9, adult: Secondary | ICD-10-CM | POA: Diagnosis not present

## 2019-06-18 DIAGNOSIS — Z7689 Persons encountering health services in other specified circumstances: Secondary | ICD-10-CM | POA: Diagnosis not present

## 2019-06-18 DIAGNOSIS — E669 Obesity, unspecified: Secondary | ICD-10-CM | POA: Diagnosis not present

## 2019-06-18 DIAGNOSIS — Z6837 Body mass index (BMI) 37.0-37.9, adult: Secondary | ICD-10-CM | POA: Diagnosis not present

## 2019-08-27 DIAGNOSIS — H402212 Chronic angle-closure glaucoma, right eye, moderate stage: Secondary | ICD-10-CM | POA: Diagnosis not present

## 2019-11-13 DIAGNOSIS — M170 Bilateral primary osteoarthritis of knee: Secondary | ICD-10-CM | POA: Diagnosis not present

## 2019-12-25 DIAGNOSIS — M170 Bilateral primary osteoarthritis of knee: Secondary | ICD-10-CM | POA: Diagnosis not present

## 2019-12-30 DIAGNOSIS — M7021 Olecranon bursitis, right elbow: Secondary | ICD-10-CM | POA: Diagnosis not present

## 2020-01-21 ENCOUNTER — Ambulatory Visit (INDEPENDENT_AMBULATORY_CARE_PROVIDER_SITE_OTHER): Payer: Medicare Other | Admitting: Family Medicine

## 2020-01-21 ENCOUNTER — Encounter: Payer: Self-pay | Admitting: Family Medicine

## 2020-01-21 ENCOUNTER — Other Ambulatory Visit: Payer: Self-pay

## 2020-01-21 VITALS — BP 137/82 | HR 86 | Temp 98.3°F | Resp 20 | Ht 71.0 in | Wt 260.4 lb

## 2020-01-21 DIAGNOSIS — Z79899 Other long term (current) drug therapy: Secondary | ICD-10-CM

## 2020-01-21 DIAGNOSIS — E7800 Pure hypercholesterolemia, unspecified: Secondary | ICD-10-CM | POA: Diagnosis not present

## 2020-01-21 DIAGNOSIS — K219 Gastro-esophageal reflux disease without esophagitis: Secondary | ICD-10-CM

## 2020-01-21 DIAGNOSIS — I10 Essential (primary) hypertension: Secondary | ICD-10-CM

## 2020-01-21 DIAGNOSIS — Z125 Encounter for screening for malignant neoplasm of prostate: Secondary | ICD-10-CM

## 2020-01-21 DIAGNOSIS — F419 Anxiety disorder, unspecified: Secondary | ICD-10-CM

## 2020-01-21 MED ORDER — PANTOPRAZOLE SODIUM 20 MG PO TBEC: 20.0000 mg | DELAYED_RELEASE_TABLET | Freq: Every day | ORAL | 3 refills | Status: DC

## 2020-01-21 MED ORDER — HYDROCHLOROTHIAZIDE 25 MG PO TABS: 25.0000 mg | ORAL_TABLET | Freq: Every day | ORAL | 3 refills | Status: DC

## 2020-01-21 MED ORDER — ALPRAZOLAM 1 MG PO TABS: 1.0000 mg | ORAL_TABLET | Freq: Three times a day (TID) | ORAL | 5 refills | Status: DC | PRN

## 2020-01-21 MED ORDER — CITALOPRAM HYDROBROMIDE 20 MG PO TABS: 20.0000 mg | ORAL_TABLET | Freq: Every day | ORAL | 3 refills | Status: DC

## 2020-01-21 MED ORDER — PRAVASTATIN SODIUM 40 MG PO TABS: 40.0000 mg | ORAL_TABLET | Freq: Every day | ORAL | 3 refills | Status: DC

## 2020-01-21 MED ORDER — VERAPAMIL HCL ER 180 MG PO TBCR: 180.0000 mg | EXTENDED_RELEASE_TABLET | Freq: Every day | ORAL | 3 refills | Status: DC

## 2020-01-21 NOTE — Progress Notes (Signed)
Subjective:  Patient ID: Mark Sharp, male    DOB: September 14, 1955  Age: 64 y.o. MRN: [id number]  CC: Medication Refill   HPI Mark Sharp presents for  follow-up of hypertension. Patient has no history of headache chest pain or shortness of breath or recent cough. Patient also denies symptoms of TIA such as focal numbness or weakness. Patient denies side effects from medication. States taking it regularly.  Patient has been taking Xanax for greater than 10 years due to his anxiety.  He takes 1-1/2 to 2 pills daily long-term.  Patient in for follow-up of elevated cholesterol. Doing well without complaints on current medication. Denies side effects of statin including myalgia and arthralgia and nausea. Also in today for liver function testing. Currently no chest pain, shortness of breath or other cardiovascular related symptoms noted.   History Mark Sharp has a past medical history of Anxiety, Depression, GERD (gastroesophageal reflux disease), Glaucoma, Hyperlipidemia, and Hypertension.   He has a past surgical history that includes Eye surgery and Spine surgery.   His family history includes Arthritis in his mother; Cancer in his mother; Diabetes in his mother.He reports that he quit smoking about 31 years ago. He has never used smokeless tobacco. He reports that he does not drink alcohol and does not use drugs.  Current Outpatient Medications on File Prior to Visit  Medication Sig Dispense Refill  . latanoprost (XALATAN) 0.005 % ophthalmic solution      No current facility-administered medications on file prior to visit.    ROS Review of Systems  Constitutional: Negative for fever.  Respiratory: Negative for shortness of breath.   Cardiovascular: Negative for chest pain.  Musculoskeletal: Negative for arthralgias.  Skin: Negative for rash.    Objective:  BP 137/82   Pulse 86   Temp 98.3 F (36.8 C) (Temporal)   Resp 20   Ht 5' 11"  (1.803 m)   Wt 260 lb 6 oz (118.1 kg)   SpO2  96%   BMI 36.31 kg/m   BP Readings from Last 3 Encounters:  01/21/20 137/82  04/29/18 133/88  10/25/17 (!) 144/89    Wt Readings from Last 3 Encounters:  01/21/20 260 lb 6 oz (118.1 kg)  04/29/18 264 lb (119.7 kg)  10/25/17 264 lb (119.7 kg)     Physical Exam Vitals reviewed.  Constitutional:      Appearance: He is well-developed.  HENT:     Head: Normocephalic and atraumatic.     Right Ear: Tympanic membrane and external ear normal. No decreased hearing noted.     Left Ear: Tympanic membrane and external ear normal. No decreased hearing noted.     Mouth/Throat:     Pharynx: No oropharyngeal exudate or posterior oropharyngeal erythema.  Eyes:     Pupils: Pupils are equal, round, and reactive to light.  Cardiovascular:     Rate and Rhythm: Normal rate and regular rhythm.     Heart sounds: No murmur heard.   Pulmonary:     Effort: No respiratory distress.     Breath sounds: Normal breath sounds.  Abdominal:     General: Bowel sounds are normal.     Palpations: Abdomen is soft. There is no mass.     Tenderness: There is no abdominal tenderness.  Musculoskeletal:     Cervical back: Normal range of motion and neck supple.       Assessment & Plan:   [name] was seen today for medication refill.  Diagnoses and all orders for  this visit:  Essential hypertension -     hydrochlorothiazide (HYDRODIURIL) 25 MG tablet; Take 1 tablet (25 mg total) by mouth daily. -     verapamil (CALAN-SR) 180 MG CR tablet; Take 1 tablet (180 mg total) by mouth daily. -     CBC with Differential/Platelet -     CMP14+EGFR -     Lipid panel  Anxiety -     ALPRAZolam (XANAX) 1 MG tablet; Take 1 tablet (1 mg total) by mouth 3 (three) times daily as needed for anxiety. -     citalopram (CELEXA) 20 MG tablet; Take 1 tablet (20 mg total) by mouth daily. -     ToxASSURE Select 13 (MW), Urine  Gastroesophageal reflux disease without esophagitis -     pantoprazole (PROTONIX) 20 MG tablet; Take  1 tablet (20 mg total) by mouth daily. -     CBC with Differential/Platelet -     CMP14+EGFR  Pure hypercholesterolemia -     pravastatin (PRAVACHOL) 40 MG tablet; Take 1 tablet (40 mg total) by mouth daily. -     Lipid panel  Controlled substance agreement signed -     ToxASSURE Select 13 (MW), Urine -     Cancel: ToxASSURE Select 13 (MW), Urine  Screening for prostate cancer -     Cancel: PSA Total (Reflex To Free)  Other orders -     PSA Total (Reflex To Free) -     Specimen status report   Allergies as of 01/21/2020      Reactions   Penicillins Rash      Medication List       Accurate as of January 21, 2020 11:59 PM. If you have any questions, ask your nurse or doctor.        ALPRAZolam 1 MG tablet Commonly known as: Xanax Take 1 tablet (1 mg total) by mouth 3 (three) times daily as needed for anxiety.   citalopram 20 MG tablet Commonly known as: CELEXA Take 1 tablet (20 mg total) by mouth daily.   hydrochlorothiazide 25 MG tablet Commonly known as: HYDRODIURIL Take 1 tablet (25 mg total) by mouth daily.   latanoprost 0.005 % ophthalmic solution Commonly known as: XALATAN   pantoprazole 20 MG tablet Commonly known as: PROTONIX Take 1 tablet (20 mg total) by mouth daily.   pravastatin 40 MG tablet Commonly known as: PRAVACHOL Take 1 tablet (40 mg total) by mouth daily.   verapamil 180 MG CR tablet Commonly known as: CALAN-SR Take 1 tablet (180 mg total) by mouth daily. What changed: when to take this Changed by: Mark Fraise, MD       Meds ordered this encounter  Medications  . ALPRAZolam (XANAX) 1 MG tablet    Sig: Take 1 tablet (1 mg total) by mouth 3 (three) times daily as needed for anxiety.    Dispense:  90 tablet    Refill:  5  . citalopram (CELEXA) 20 MG tablet    Sig: Take 1 tablet (20 mg total) by mouth daily.    Dispense:  90 tablet    Refill:  3  . hydrochlorothiazide (HYDRODIURIL) 25 MG tablet    Sig: Take 1 tablet (25 mg total)  by mouth daily.    Dispense:  90 tablet    Refill:  3  . pantoprazole (PROTONIX) 20 MG tablet    Sig: Take 1 tablet (20 mg total) by mouth daily.    Dispense:  90 tablet    Refill:  3  . pravastatin (PRAVACHOL) 40 MG tablet    Sig: Take 1 tablet (40 mg total) by mouth daily.    Dispense:  90 tablet    Refill:  3  . verapamil (CALAN-SR) 180 MG CR tablet    Sig: Take 1 tablet (180 mg total) by mouth daily.    Dispense:  90 tablet    Refill:  3      Follow-up: Return in about 3 months (around 04/22/2020).  Mark Sharp, M.D.

## 2020-01-22 ENCOUNTER — Telehealth: Payer: Self-pay | Admitting: Family Medicine

## 2020-01-22 LAB — CMP14+EGFR
ALT: 23 IU/L (ref 0–44)
AST: 33 IU/L (ref 0–40)
Albumin/Globulin Ratio: 1.2 (ref 1.2–2.2)
Albumin: 4.2 g/dL (ref 3.8–4.8)
Alkaline Phosphatase: 126 IU/L — ABNORMAL HIGH (ref 48–121)
BUN/Creatinine Ratio: 17 (ref 10–24)
BUN: 16 mg/dL (ref 8–27)
Bilirubin Total: 0.4 mg/dL (ref 0.0–1.2)
CO2: 27 mmol/L (ref 20–29)
Calcium: 9.6 mg/dL (ref 8.6–10.2)
Chloride: 102 mmol/L (ref 96–106)
Creatinine, Ser: 0.93 mg/dL (ref 0.76–1.27)
GFR calc Af Amer: 99 mL/min/{1.73_m2} (ref >59)
GFR calc non Af Amer: 86 mL/min/{1.73_m2} (ref >59)
Globulin, Total: 3.6 g/dL (ref 1.5–4.5)
Glucose: 96 mg/dL (ref 65–99)
Potassium: 3.9 mmol/L (ref 3.5–5.2)
Sodium: 139 mmol/L (ref 134–144)
Total Protein: 7.8 g/dL (ref 6.0–8.5)

## 2020-01-22 LAB — CBC WITH DIFFERENTIAL/PLATELET
Basophils Absolute: 0.1 10*3/uL (ref 0.0–0.2)
Basos: 1 %
EOS (ABSOLUTE): 0.5 10*3/uL — ABNORMAL HIGH (ref 0.0–0.4)
Eos: 5 %
Hematocrit: 38.9 % (ref 37.5–51.0)
Hemoglobin: 12.9 g/dL — ABNORMAL LOW (ref 13.0–17.7)
Immature Grans (Abs): 0.1 10*3/uL (ref 0.0–0.1)
Immature Granulocytes: 1 %
Lymphocytes Absolute: 2.7 10*3/uL (ref 0.7–3.1)
Lymphs: 27 %
MCH: 29.9 pg (ref 26.6–33.0)
MCHC: 33.2 g/dL (ref 31.5–35.7)
MCV: 90 fL (ref 79–97)
Monocytes Absolute: 1.1 10*3/uL — ABNORMAL HIGH (ref 0.1–0.9)
Monocytes: 11 %
Neutrophils Absolute: 5.7 10*3/uL (ref 1.4–7.0)
Neutrophils: 55 %
Platelets: 279 10*3/uL (ref 150–450)
RBC: 4.32 x10E6/uL (ref 4.14–5.80)
RDW: 14.3 % (ref 11.6–15.4)
WBC: 10.1 10*3/uL (ref 3.4–10.8)

## 2020-01-22 LAB — LIPID PANEL
Chol/HDL Ratio: 7 ratio — ABNORMAL HIGH (ref 0.0–5.0)
Cholesterol, Total: 211 mg/dL — ABNORMAL HIGH (ref 100–199)
HDL: 30 mg/dL — ABNORMAL LOW (ref >39)
LDL Chol Calc (NIH): 145 mg/dL — ABNORMAL HIGH (ref 0–99)
Triglycerides: 195 mg/dL — ABNORMAL HIGH (ref 0–149)
VLDL Cholesterol Cal: 36 mg/dL (ref 5–40)

## 2020-01-22 NOTE — Telephone Encounter (Signed)
Pt returned missed call from [facility] regarding lab results. Reviewed results with pt per Dr Darlyn Read notes. Pt says his cholesterol probably has been higher than normal because he ran out of his cholesterol medicine so he hasn't taken since running out.

## 2020-01-22 NOTE — Telephone Encounter (Signed)
Called  And aware to take meds as directed

## 2020-01-23 LAB — PSA TOTAL (REFLEX TO FREE): Prostate Specific Ag, Serum: 0.8 ng/mL (ref 0.0–4.0)

## 2020-01-23 LAB — TOXASSURE SELECT 13 (MW), URINE

## 2020-01-23 LAB — SPECIMEN STATUS REPORT

## 2020-01-26 ENCOUNTER — Encounter: Payer: Self-pay | Admitting: Family Medicine

## 2020-02-05 DIAGNOSIS — M170 Bilateral primary osteoarthritis of knee: Secondary | ICD-10-CM | POA: Diagnosis not present

## 2020-03-11 DIAGNOSIS — H402223 Chronic angle-closure glaucoma, left eye, severe stage: Secondary | ICD-10-CM | POA: Diagnosis not present

## 2020-05-07 DIAGNOSIS — M170 Bilateral primary osteoarthritis of knee: Secondary | ICD-10-CM | POA: Diagnosis not present

## 2020-07-21 ENCOUNTER — Other Ambulatory Visit: Payer: Self-pay | Admitting: Family Medicine

## 2020-07-21 DIAGNOSIS — F419 Anxiety disorder, unspecified: Secondary | ICD-10-CM

## 2020-07-22 ENCOUNTER — Ambulatory Visit: Payer: Medicare Other | Admitting: Family Medicine

## 2020-08-06 DIAGNOSIS — M170 Bilateral primary osteoarthritis of knee: Secondary | ICD-10-CM | POA: Diagnosis not present

## 2020-08-27 DIAGNOSIS — M170 Bilateral primary osteoarthritis of knee: Secondary | ICD-10-CM | POA: Diagnosis not present

## 2020-09-01 ENCOUNTER — Other Ambulatory Visit: Payer: Self-pay

## 2020-09-01 ENCOUNTER — Encounter: Payer: Self-pay | Admitting: Family Medicine

## 2020-09-01 ENCOUNTER — Ambulatory Visit (INDEPENDENT_AMBULATORY_CARE_PROVIDER_SITE_OTHER): Payer: Medicare Other | Admitting: Family Medicine

## 2020-09-01 VITALS — BP 125/74 | HR 89 | Temp 98.6°F | Ht 71.0 in | Wt 269.6 lb

## 2020-09-01 DIAGNOSIS — I10 Essential (primary) hypertension: Secondary | ICD-10-CM | POA: Diagnosis not present

## 2020-09-01 DIAGNOSIS — F419 Anxiety disorder, unspecified: Secondary | ICD-10-CM

## 2020-09-01 DIAGNOSIS — M170 Bilateral primary osteoarthritis of knee: Secondary | ICD-10-CM | POA: Diagnosis not present

## 2020-09-01 DIAGNOSIS — Z23 Encounter for immunization: Secondary | ICD-10-CM | POA: Diagnosis not present

## 2020-09-01 DIAGNOSIS — Z1159 Encounter for screening for other viral diseases: Secondary | ICD-10-CM

## 2020-09-01 DIAGNOSIS — E7800 Pure hypercholesterolemia, unspecified: Secondary | ICD-10-CM | POA: Diagnosis not present

## 2020-09-01 LAB — CMP14+EGFR

## 2020-09-01 LAB — LIPID PANEL

## 2020-09-01 LAB — CBC WITH DIFFERENTIAL/PLATELET
Basos: 1 %
Immature Granulocytes: 3 %
Lymphocytes Absolute: 2.2 10*3/uL (ref 0.7–3.1)
Monocytes: 9 %
Platelets: 262 10*3/uL (ref 150–450)
WBC: 8.1 10*3/uL (ref 3.4–10.8)

## 2020-09-01 LAB — HEPATITIS C ANTIBODY

## 2020-09-01 MED ORDER — ALPRAZOLAM 1 MG PO TABS
1.0000 mg | ORAL_TABLET | Freq: Three times a day (TID) | ORAL | 5 refills | Status: DC | PRN
Start: 2020-09-01 — End: 2021-01-10

## 2020-09-01 MED ORDER — TRULICITY 0.75 MG/0.5ML ~~LOC~~ SOAJ: 0.7500 mg | SUBCUTANEOUS | 2 refills | Status: AC

## 2020-09-01 NOTE — Progress Notes (Signed)
Subjective:  Patient ID: Mark Sharp, male    DOB: 31-Oct-1955  Age: 65 y.o. MRN: [id number]  CC: Hypertension   HPI Mark Sharp presents for  follow-up of hypertension. Patient has no history of headache chest pain or shortness of breath or recent cough. Patient also denies symptoms of TIA such as focal numbness or weakness. Patient denies side effects from medication. States taking it regularly.  Pt. Tried to cut back on xanax and got very jittery, nervous. Had to go back to three a day. Has panic disorder that responds to xanax within 15 minutes. Occur about twice a week.    in for follow-up of elevated cholesterol. Doing well without complaints on current medication. Denies side effects of statin including myalgia and arthralgia and nausea. Currently no chest pain, shortness of breath or other cardiovascular related symptoms noted.    History Mark Sharp has a past medical history of Anxiety, Depression, GERD (gastroesophageal reflux disease), Glaucoma, Hyperlipidemia, and Hypertension.   Mark Sharp has a past surgical history that includes Eye surgery and Spine surgery.   His family history includes Arthritis in his mother; Cancer in his mother; Diabetes in his mother.Mark Sharp reports that Mark Sharp quit smoking about 32 years ago. Mark Sharp has never used smokeless tobacco. Mark Sharp reports that Mark Sharp does not drink alcohol and does not use drugs.  Current Outpatient Medications on File Prior to Visit  Medication Sig Dispense Refill  . citalopram (CELEXA) 20 MG tablet Take 1 tablet (20 mg total) by mouth daily. 90 tablet 3  . hydrochlorothiazide (HYDRODIURIL) 25 MG tablet Take 1 tablet (25 mg total) by mouth daily. 90 tablet 3  . latanoprost (XALATAN) 0.005 % ophthalmic solution     . pantoprazole (PROTONIX) 20 MG tablet Take 1 tablet (20 mg total) by mouth daily. 90 tablet 3  . pravastatin (PRAVACHOL) 40 MG tablet Take 1 tablet (40 mg total) by mouth daily. 90 tablet 3  . verapamil (CALAN-SR) 180 MG CR tablet Take 1  tablet (180 mg total) by mouth daily. 90 tablet 3   No current facility-administered medications on file prior to visit.    ROS Review of Systems  Musculoskeletal: Positive for arthralgias (knees, seeing ortho. Had synvisc on left  recently. ).    Objective:  BP 125/74   Pulse 89   Temp 98.6 F (37 C) (Temporal)   Ht _0  (1.803 m)   Wt 269 lb 9.6 oz (122.3 kg)   BMI 37.60 kg/m   BP Readings from Last 3 Encounters:  09/01/20 125/74  01/21/20 137/82  04/29/18 133/88    Wt Readings from Last 3 Encounters:  09/01/20 269 lb 9.6 oz (122.3 kg)  01/21/20 260 lb 6 oz (118.1 kg)  04/29/18 264 lb (119.7 kg)     Physical Exam Vitals reviewed.  Constitutional:      Appearance: Mark Sharp is well-developed and well-nourished.  HENT:     Head: Normocephalic and atraumatic.     Right Ear: External ear normal.     Left Ear: External ear normal.     Mouth/Throat:     Pharynx: No oropharyngeal exudate or posterior oropharyngeal erythema.  Eyes:     Pupils: Pupils are equal, round, and reactive to light.  Cardiovascular:     Rate and Rhythm: Normal rate and regular rhythm.     Heart sounds: No murmur heard.   Pulmonary:     Effort: No respiratory distress.     Breath sounds: Normal breath sounds.  Musculoskeletal:  Cervical back: Normal range of motion and neck supple.  Neurological:     Mental Status: Mark Sharp is alert and oriented to person, place, and time.       Assessment & Plan:   Mark Sharp was seen today for hypertension.  Diagnoses and all orders for this visit:  Need for pneumococcal vaccine  Need for hepatitis C screening test -     Hepatitis C antibody  Essential hypertension -     CMP14+EGFR -     Lipid panel -     CBC with Differential/Platelet  Primary osteoarthritis of both knees -     CMP14+EGFR -     Lipid panel -     CBC with Differential/Platelet  Pure hypercholesterolemia -     CMP14+EGFR -     Lipid panel -     CBC with  Differential/Platelet  Anxiety -     ALPRAZolam (XANAX) 1 MG tablet; Take 1 tablet (1 mg total) by mouth 3 (three) times daily as needed for anxiety.  Need for immunization against influenza -     Flu Vaccine QUAD High Dose(Fluad)  Other orders -     Pneumococcal conjugate vaccine 13-valent -     Dulaglutide (TRULICITY) 0.81 KG/8.1EH SOPN; Inject 0.75 mg into the skin once a week.   Allergies as of 09/01/2020      Reactions   Penicillins Rash      Medication List       Accurate as of September 01, 2020  9:19 PM. If you have any questions, ask your nurse or doctor.        ALPRAZolam 1 MG tablet Commonly known as: Xanax Take 1 tablet (1 mg total) by mouth 3 (three) times daily as needed for anxiety.   citalopram 20 MG tablet Commonly known as: CELEXA Take 1 tablet (20 mg total) by mouth daily.   hydrochlorothiazide 25 MG tablet Commonly known as: HYDRODIURIL Take 1 tablet (25 mg total) by mouth daily.   latanoprost 0.005 % ophthalmic solution Commonly known as: XALATAN   pantoprazole 20 MG tablet Commonly known as: PROTONIX Take 1 tablet (20 mg total) by mouth daily.   pravastatin 40 MG tablet Commonly known as: PRAVACHOL Take 1 tablet (40 mg total) by mouth daily.   Trulicity 6.31 SH/7.67YO Sopn Generic drug: Dulaglutide Inject 0.75 mg into the skin once a week. Started by: Claretta Fraise, MD   verapamil 180 MG CR tablet Commonly known as: CALAN-SR Take 1 tablet (180 mg total) by mouth daily.       Meds ordered this encounter  Medications  . Dulaglutide (TRULICITY) 3.78 HY/8.5OY SOPN    Sig: Inject 0.75 mg into the skin once a week.    Dispense:  2 mL    Refill:  2  . ALPRAZolam (XANAX) 1 MG tablet    Sig: Take 1 tablet (1 mg total) by mouth 3 (three) times daily as needed for anxiety.    Dispense:  90 tablet    Refill:  5      Follow-up: Return in about 6 months (around 03/04/2021).  Claretta Fraise, M.D.

## 2020-09-02 LAB — CBC WITH DIFFERENTIAL/PLATELET
Basophils Absolute: 0.1 10*3/uL (ref 0.0–0.2)
EOS (ABSOLUTE): 0.6 10*3/uL — ABNORMAL HIGH (ref 0.0–0.4)
Eos: 7 %
Hematocrit: 41.4 % (ref 37.5–51.0)
Hemoglobin: 14 g/dL (ref 13.0–17.7)
Immature Grans (Abs): 0.3 10*3/uL — ABNORMAL HIGH (ref 0.0–0.1)
Lymphs: 27 %
MCH: 31.2 pg (ref 26.6–33.0)
MCHC: 33.8 g/dL (ref 31.5–35.7)
MCV: 92 fL (ref 79–97)
Monocytes Absolute: 0.7 10*3/uL (ref 0.1–0.9)
Neutrophils Absolute: 4.3 10*3/uL (ref 1.4–7.0)
Neutrophils: 53 %
RBC: 4.49 x10E6/uL (ref 4.14–5.80)
RDW: 12.4 % (ref 11.6–15.4)

## 2020-09-02 LAB — LIPID PANEL
Chol/HDL Ratio: 4.8 ratio (ref 0.0–5.0)
Cholesterol, Total: 144 mg/dL (ref 100–199)
HDL: 30 mg/dL — ABNORMAL LOW (ref >39)
LDL Chol Calc (NIH): 93 mg/dL (ref 0–99)
Triglycerides: 116 mg/dL (ref 0–149)

## 2020-09-02 LAB — CMP14+EGFR
ALT: 24 IU/L (ref 0–44)
AST: 28 IU/L (ref 0–40)
Albumin/Globulin Ratio: 1.3 (ref 1.2–2.2)
Albumin: 3.9 g/dL (ref 3.8–4.8)
Alkaline Phosphatase: 136 IU/L — ABNORMAL HIGH (ref 44–121)
BUN/Creatinine Ratio: 14 (ref 10–24)
Bilirubin Total: 0.3 mg/dL (ref 0.0–1.2)
Calcium: 9.7 mg/dL (ref 8.6–10.2)
Chloride: 103 mmol/L (ref 96–106)
Creatinine, Ser: 0.93 mg/dL (ref 0.76–1.27)
Globulin, Total: 3 g/dL (ref 1.5–4.5)
Glucose: 134 mg/dL — ABNORMAL HIGH (ref 65–99)
Potassium: 4.7 mmol/L (ref 3.5–5.2)
eGFR: 91 mL/min/{1.73_m2} (ref >59)

## 2020-09-09 DIAGNOSIS — H402212 Chronic angle-closure glaucoma, right eye, moderate stage: Secondary | ICD-10-CM | POA: Diagnosis not present

## 2020-10-08 DIAGNOSIS — H402212 Chronic angle-closure glaucoma, right eye, moderate stage: Secondary | ICD-10-CM | POA: Diagnosis not present

## 2020-10-19 ENCOUNTER — Encounter: Payer: Self-pay | Admitting: *Deleted

## 2020-11-03 DIAGNOSIS — M170 Bilateral primary osteoarthritis of knee: Secondary | ICD-10-CM | POA: Diagnosis not present

## 2020-11-25 ENCOUNTER — Other Ambulatory Visit: Payer: Self-pay | Admitting: Family Medicine

## 2020-11-25 DIAGNOSIS — F419 Anxiety disorder, unspecified: Secondary | ICD-10-CM

## 2020-12-02 ENCOUNTER — Ambulatory Visit: Payer: Medicare Other | Admitting: Family Medicine

## 2020-12-20 ENCOUNTER — Ambulatory Visit: Payer: Medicare Other | Admitting: Family Medicine

## 2020-12-31 ENCOUNTER — Other Ambulatory Visit: Payer: Self-pay | Admitting: Family Medicine

## 2020-12-31 DIAGNOSIS — E7800 Pure hypercholesterolemia, unspecified: Secondary | ICD-10-CM

## 2021-01-07 ENCOUNTER — Ambulatory Visit (INDEPENDENT_AMBULATORY_CARE_PROVIDER_SITE_OTHER): Payer: Medicare Other | Admitting: *Deleted

## 2021-01-07 ENCOUNTER — Encounter: Payer: Self-pay | Admitting: *Deleted

## 2021-01-07 DIAGNOSIS — Z0000 Encounter for general adult medical examination without abnormal findings: Secondary | ICD-10-CM

## 2021-01-07 NOTE — Progress Notes (Addendum)
MEDICARE ANNUAL WELLNESS VISIT  01/07/2021  Telephone Visit Disclaimer This Medicare AWV was conducted by telephone due to national recommendations for restrictions regarding the COVID-19 Pandemic (e.g. social distancing).  I verified, using two identifiers, that I am speaking with Mark HockeyJerry T Faley or their authorized healthcare agent. I discussed the limitations, risks, security, and privacy concerns of performing an evaluation and management service by telephone and the potential availability of an in-person appointment in the future. The patient expressed understanding and agreed to proceed.  Location of Patient: Home  Location of Provider (nurse):  [facility]  Subjective:    Mark Sharp is a 65 y.o. male patient of Stacks, Broadus JohnWarren, MD who had a Medicare Annual Wellness Visit today via telephone. Mark Sharp is retired and lives alone. He has one daughter. He reports that he is socially active and does interact with friends/family regularly. He is minimally physically active and enjoys fishing and kayaking.   Patient Care Team: Mechele ClaudeStacks, Warren, MD as PCP - General (Family Medicine)  Advanced Directives 01/07/2021  Does Patient Have a Medical Advance Directive? No  Would patient like information on creating a medical advance directive? No - Guardian declined    Hospital Utilization Over the Past 12 Months: # of hospitalizations or ER visits: 0 # of surgeries: 0  Review of Systems    Patient reports that his overall health is unchanged compared to last year.  History obtained from the patient and patient chart.   Patient Reported Readings (BP, Pulse, CBG, Weight, etc) none  Pain Assessment Pain : 0-10 Pain Score: 5  Pain Type: Chronic pain Pain Location: Knee Pain Orientation:  (bilateral) Pain Descriptors / Indicators: Dull Pain Onset: More than a month ago Pain Frequency: Intermittent Pain Relieving Factors: aleve, lidocaine Effect of Pain on Daily Activities: none  Pain Relieving  Factors: aleve, lidocaine  Current Medications & Allergies (verified) Allergies as of 01/07/2021       Reactions   Atorvastatin Other (See Comments)   Rosuvastatin Calcium Other (See Comments)   Valsartan Other (See Comments)   Penicillins Rash        Medication List        Accurate as of January 07, 2021 10:05 AM. If you have any questions, ask your nurse or doctor.          ALPRAZolam 1 MG tablet Commonly known as: Xanax Take 1 tablet (1 mg total) by mouth 3 (three) times daily as needed for anxiety.   citalopram 20 MG tablet Commonly known as: CELEXA Take 1 tablet (20 mg total) by mouth daily.   hydrochlorothiazide 25 MG tablet Commonly known as: HYDRODIURIL Take 1 tablet (25 mg total) by mouth daily.   latanoprost 0.005 % ophthalmic solution Commonly known as: XALATAN   pantoprazole 20 MG tablet Commonly known as: PROTONIX Take 1 tablet (20 mg total) by mouth daily.   pravastatin 40 MG tablet Commonly known as: PRAVACHOL Take 1 tablet by mouth once daily   Trulicity 0.75 MG/0.5ML Sopn Generic drug: Dulaglutide Inject 0.75 mg into the skin once a week.   verapamil 180 MG CR tablet Commonly known as: CALAN-SR Take 1 tablet (180 mg total) by mouth daily.        History (reviewed): Past Medical History:  Diagnosis Date   Anxiety    Depression    GERD (gastroesophageal reflux disease)    Glaucoma    Hyperlipidemia    Hypertension    Past Surgical History:  Procedure Laterality Date  EYE SURGERY     SPINE SURGERY     Family History  Problem Relation Age of Onset   Arthritis Mother    Cancer Mother        breast   Diabetes Mother    Social History   Socioeconomic History   Marital status: Divorced    Spouse name: Not on file   Number of children: 1   Years of education: Not on file   Highest education level: 12th grade  Occupational History   Occupation: RETIRED  Tobacco Use   Smoking status: Former    Pack years: 0.00    Types:  Cigarettes    Quit date: 1990    Years since quitting: 32.5   Smokeless tobacco: Never  Vaping Use   Vaping Use: Never used  Substance and Sexual Activity   Alcohol use: Not Currently    Comment: occasional   Drug use: No   Sexual activity: Not Currently  Other Topics Concern   Not on file  Social History Narrative   Lives alone. Enjoys kayaking and fishing. One daughter. One dog.    Social Determinants of Health   Financial Resource Strain: Not on file  Food Insecurity: Not on file  Transportation Needs: Not on file  Physical Activity: Not on file  Stress: Not on file  Social Connections: Not on file    Activities of Daily Living In your present state of health, do you have any difficulty performing the following activities: 01/07/2021  Hearing? N  Vision? Y  Comment glaucoma in left eye  Difficulty concentrating or making decisions? N  Walking or climbing stairs? Y  Comment knee pain  Dressing or bathing? N  Doing errands, shopping? N  Preparing Food and eating ? N  Using the Toilet? N  In the past six months, have you accidently leaked urine? N  Do you have problems with loss of bowel control? N  Managing your Medications? N  Managing your Finances? N  Housekeeping or managing your Housekeeping? N  Some recent data might be hidden    Patient Education/ Literacy How often do you need to have someone help you when you read instructions, pamphlets, or other written materials from your doctor or pharmacy?: 1 - Never What is the last grade level you completed in school?: 12th  Exercise Current Exercise Habits: Home exercise routine, Time (Minutes): 30, Frequency (Times/Week): 3, Weekly Exercise (Minutes/Week): 90, Intensity: Mild, Exercise limited by: orthopedic condition(s) (bad knees)  Diet Patient reports consuming 2 meals a day and 0 snack(s) a day Patient reports that his primary diet is: Regular Patient reports that she does have regular access to food.    Depression Screen PHQ 2/9 Scores 01/07/2021 09/01/2020 01/21/2020 04/29/2018 10/25/2017 01/01/2017  PHQ - 2 Score 0 0 0 0 0 0     Fall Risk Fall Risk  01/07/2021 09/01/2020 04/29/2018 10/25/2017  Falls in the past year? 1 0 No No  Number falls in past yr: 1 - - -  Injury with Fall? 0 - - -  Risk for fall due to : History of fall(s);Orthopedic patient - - -  Risk for fall due to: Comment knee problems - - -  Follow up Falls evaluation completed - - -     Objective:  Mark Hockey seemed alert and oriented and he participated appropriately during our telephone visit.  Blood Pressure Weight BMI  BP Readings from Last 3 Encounters:  09/01/20 125/74  01/21/20 137/82  04/29/18  133/88   Wt Readings from Last 3 Encounters:  09/01/20 269 lb 9.6 oz (122.3 kg)  01/21/20 260 lb 6 oz (118.1 kg)  04/29/18 264 lb (119.7 kg)   BMI Readings from Last 1 Encounters:  09/01/20 37.60 kg/m    *Unable to obtain current vital signs, weight, and BMI due to telephone visit type  Hearing/Vision  Chayson did not seem to have difficulty with hearing/understanding during the telephone conversation Reports that he has had a formal eye exam by an eye care professional within the past year Reports that he has not had a formal hearing evaluation within the past year *Unable to fully assess hearing and vision during telephone visit type  Cognitive Function: 6CIT Screen 01/07/2021  What Year? 0 points  What month? 0 points  What time? 0 points  Count back from 20 0 points  Months in reverse 0 points  Repeat phrase 2 points  Total Score 2   (Normal:0-7, Significant for Dysfunction: >8)  Normal Cognitive Function Screening: Yes   Immunization & Health Maintenance Record Immunization History  Administered Date(s) Administered   Fluad Quad(high Dose 65+) 09/01/2020   Influenza Inj Mdck Quad Pf 04/03/2017   Influenza-Unspecified 04/03/2015, 03/26/2018, 04/14/2019   Moderna Sars-Covid-2 Vaccination 09/08/2019,  10/06/2019, 05/07/2020   Pneumococcal Conjugate-13 09/01/2020    Health Maintenance  Topic Date Due   Zoster Vaccines- Shingrix (1 of 2) Never done   COVID-19 Vaccine (4 - Booster for Moderna series) 08/07/2020   COLONOSCOPY (Pts 45-61yrs Insurance coverage will need to be confirmed)  09/01/2021 (Originally 02/07/1999)   TETANUS/TDAP  09/01/2021 (Originally 02/06/1973)   INFLUENZA VACCINE  01/31/2021   PNA vac Low Risk Adult (2 of 2 - PPSV23) 09/01/2021   Hepatitis C Screening  Completed   HPV VACCINES  Aged Out       Assessment  This is a routine wellness examination for Mark Hockey.  Health Maintenance: Due or Overdue Health Maintenance Due  Topic Date Due   Zoster Vaccines- Shingrix (1 of 2) Never done   COVID-19 Vaccine (4 - Booster for Moderna series) 08/07/2020    Mark Hockey does not need a referral for Community Assistance: Care Management:   no Social Work:    no Prescription Assistance:  no Nutrition/Diabetes Education:  no   Plan:  Personalized Goals  Goals Addressed             This Visit's Progress    Patient Stated       01/07/2021 AWV Goal: Fall Prevention  Over the next year, patient will decrease their risk for falls by: Using assistive devices, such as a cane or walker, as needed Identifying fall risks within their home and correcting them by: Removing throw rugs Adding handrails to stairs or ramps Removing clutter and keeping a clear pathway throughout the home Increasing light, especially at night Adding shower handles/bars Raising toilet seat Identifying potential personal risk factors for falls: Medication side effects Incontinence/urgency Vestibular dysfunction Hearing loss Musculoskeletal disorders Neurological disorders Orthostatic hypotension          Personalized Health Maintenance & Screening Recommendations     Lung Cancer Screening Recommended: no (Low Dose CT Chest recommended if Age 50-80 years, 30 pack-year  currently smoking OR have quit w/in past 15 years) Hepatitis C Screening recommended: no HIV Screening recommended: no  Advanced Directives: Written information was not prepared per patient's request.  Referrals & Orders No orders of the defined types were placed in this encounter.  Follow-up Plan Follow-up with Mechele Claude, MD as planned   I have personally reviewed and noted the following in the patient's chart:   Medical and social history Use of alcohol, tobacco or illicit drugs  Current medications and supplements Functional ability and status Nutritional status Physical activity Advanced directives List of other physicians Hospitalizations, surgeries, and ER visits in previous 12 months Vitals Screenings to include cognitive, depression, and falls Referrals and appointments  In addition, I have reviewed and discussed with Mark Hockey certain preventive protocols, quality metrics, and best practice recommendations. A written personalized care plan for preventive services as well as general preventive health recommendations is available and can be mailed to the patient at his request.      Diamantina Monks, LPN  01/09/8920

## 2021-01-10 ENCOUNTER — Encounter: Payer: Self-pay | Admitting: Family Medicine

## 2021-01-10 ENCOUNTER — Other Ambulatory Visit: Payer: Self-pay

## 2021-01-10 ENCOUNTER — Ambulatory Visit (INDEPENDENT_AMBULATORY_CARE_PROVIDER_SITE_OTHER): Payer: Medicare Other | Admitting: Family Medicine

## 2021-01-10 VITALS — BP 127/69 | HR 86 | Temp 97.7°F | Ht 71.0 in | Wt 258.8 lb

## 2021-01-10 DIAGNOSIS — F419 Anxiety disorder, unspecified: Secondary | ICD-10-CM

## 2021-01-10 DIAGNOSIS — K219 Gastro-esophageal reflux disease without esophagitis: Secondary | ICD-10-CM | POA: Diagnosis not present

## 2021-01-10 DIAGNOSIS — E7800 Pure hypercholesterolemia, unspecified: Secondary | ICD-10-CM | POA: Diagnosis not present

## 2021-01-10 DIAGNOSIS — I10 Essential (primary) hypertension: Secondary | ICD-10-CM

## 2021-01-10 MED ORDER — CITALOPRAM HYDROBROMIDE 20 MG PO TABS: 20.0000 mg | ORAL_TABLET | Freq: Every day | ORAL | 3 refills | Status: DC

## 2021-01-10 MED ORDER — VERAPAMIL HCL ER 180 MG PO TBCR: 180.0000 mg | EXTENDED_RELEASE_TABLET | Freq: Every day | ORAL | 3 refills | Status: DC

## 2021-01-10 MED ORDER — ALPRAZOLAM 1 MG PO TABS: 1.0000 mg | ORAL_TABLET | Freq: Three times a day (TID) | ORAL | 5 refills | Status: DC | PRN

## 2021-01-10 MED ORDER — HYDROCHLOROTHIAZIDE 25 MG PO TABS
25.0000 mg | ORAL_TABLET | Freq: Every day | ORAL | 3 refills | Status: DC
Start: 2021-01-10 — End: 2022-01-09

## 2021-01-10 MED ORDER — PANTOPRAZOLE SODIUM 20 MG PO TBEC: 20.0000 mg | DELAYED_RELEASE_TABLET | Freq: Every day | ORAL | 3 refills | Status: DC

## 2021-01-10 MED ORDER — PRAVASTATIN SODIUM 40 MG PO TABS: 40.0000 mg | ORAL_TABLET | Freq: Every day | ORAL | 3 refills | Status: DC

## 2021-01-10 NOTE — Progress Notes (Signed)
Subjective:  Patient ID: Mark Sharp, male    DOB: 08-13-1955  Age: 65 y.o. MRN: [id number]  CC: Medical Management of Chronic Issues   HPI Mark Sharp presents for  follow-up of hypertension. Patient has no history of headache chest pain or shortness of breath or recent cough. Patient also denies symptoms of TIA such as focal numbness or weakness. Patient denies side effects from medication. States taking it regularly.   in for follow-up of elevated cholesterol. Doing well without complaints on current medication. Denies side effects of statin including myalgia and arthralgia and nausea. Currently no chest pain, shortness of breath or other cardiovascular related symptoms noted.  Anxiety well-controlled.  Continues to use Xanax 3 times daily.  PDMP review shows his sedative score to be 370.  However his overdose risk score is 60.  His daily LMP is 2.2.  There are no discrepancies in his prescription filling history.  Patient in for follow-up of GERD. Currently asymptomatic taking  PPI daily. There is no chest pain or heartburn. No hematemesis and no melena. No dysphagia or choking. Onset is remote. Progression is stable. Complicating factors, none.   GAD 7 : Generalized Anxiety Score 01/10/2021  Nervous, Anxious, on Edge 1  Control/stop worrying 0  Worry too much - different things 0  Trouble relaxing 0  Restless 0  Easily annoyed or irritable 0  Afraid - awful might happen 0  Total GAD 7 Score 1  Anxiety Difficulty Somewhat difficult   Depression screen [employer] 2/9 01/10/2021 01/10/2021 01/07/2021 09/01/2020 01/21/2020  Decreased Interest 2 0 0 0 0  Down, Depressed, Hopeless 1 0 0 0 0  PHQ - 2 Score 3 0 0 0 0  Altered sleeping 1 - - - -  Tired, decreased energy 1 - - - -  Change in appetite 1 - - - -  Feeling bad or failure about yourself  0 - - - -  Trouble concentrating 0 - - - -  Moving slowly or fidgety/restless 0 - - - -  Suicidal thoughts 0 - - - -  PHQ-9 Score 6 - - - -   Difficult doing work/chores Somewhat difficult - - - -     History Mark Sharp has a past medical history of Anxiety, Depression, GERD (gastroesophageal reflux disease), Glaucoma, Hyperlipidemia, and Hypertension.   He has a past surgical history that includes Eye surgery and Spine surgery.   His family history includes Arthritis in his mother; Cancer in his mother; Diabetes in his mother.He reports that he quit smoking about 32 years ago. His smoking use included cigarettes. He has never used smokeless tobacco. He reports previous alcohol use. He reports that he does not use drugs.  Current Outpatient Medications on File Prior to Visit  Medication Sig Dispense Refill   Dulaglutide (TRULICITY) 0.75 MG/0.5ML SOPN Inject 0.75 mg into the skin once a week. 2 mL 2   latanoprost (XALATAN) 0.005 % ophthalmic solution      No current facility-administered medications on file prior to visit.    ROS Review of Systems  Constitutional:  Negative for fever.  Respiratory:  Negative for shortness of breath.   Cardiovascular:  Negative for chest pain.  Musculoskeletal:  Positive for arthralgias (knees - planning TKA soon. Seeing Surgeon on 8/4).  Skin:  Negative for rash.   Objective:  BP 127/69   Pulse 86   Temp 97.7 F (36.5 C)   Ht 5\' 11"  (1.803 m)   Wt 258 lb  12.8 oz (117.4 kg)   SpO2 96%   BMI 36.10 kg/m   BP Readings from Last 3 Encounters:  01/10/21 127/69  09/01/20 125/74  01/21/20 137/82    Wt Readings from Last 3 Encounters:  01/10/21 258 lb 12.8 oz (117.4 kg)  09/01/20 269 lb 9.6 oz (122.3 kg)  01/21/20 260 lb 6 oz (118.1 kg)     Physical Exam Vitals reviewed.  Constitutional:      Appearance: He is well-developed.  HENT:     Head: Normocephalic and atraumatic.     Right Ear: External ear normal.     Left Ear: External ear normal.     Mouth/Throat:     Pharynx: No oropharyngeal exudate or posterior oropharyngeal erythema.  Eyes:     Pupils: Pupils are equal,  round, and reactive to light.  Cardiovascular:     Rate and Rhythm: Normal rate and regular rhythm.     Heart sounds: No murmur heard. Pulmonary:     Effort: No respiratory distress.     Breath sounds: Normal breath sounds.  Musculoskeletal:     Cervical back: Normal range of motion and neck supple.  Neurological:     Mental Status: He is alert and oriented to person, place, and time.      Assessment & Plan:   Mark Sharp was seen today for medical management of chronic issues.  Diagnoses and all orders for this visit:  Pure hypercholesterolemia -     pravastatin (PRAVACHOL) 40 MG tablet; Take 1 tablet (40 mg total) by mouth daily.  Anxiety -     citalopram (CELEXA) 20 MG tablet; Take 1 tablet (20 mg total) by mouth daily. -     ALPRAZolam (XANAX) 1 MG tablet; Take 1 tablet (1 mg total) by mouth 3 (three) times daily as needed for anxiety.  Essential hypertension -     hydrochlorothiazide (HYDRODIURIL) 25 MG tablet; Take 1 tablet (25 mg total) by mouth daily. -     verapamil (CALAN-SR) 180 MG CR tablet; Take 1 tablet (180 mg total) by mouth daily.  Gastroesophageal reflux disease without esophagitis -     pantoprazole (PROTONIX) 20 MG tablet; Take 1 tablet (20 mg total) by mouth daily.  Allergies as of 01/10/2021       Reactions   Atorvastatin Other (See Comments)   Rosuvastatin Calcium Other (See Comments)   Valsartan Other (See Comments)   Penicillins Rash        Medication List        Accurate as of January 10, 2021 11:59 PM. If you have any questions, ask your nurse or doctor.          ALPRAZolam 1 MG tablet Commonly known as: Xanax Take 1 tablet (1 mg total) by mouth 3 (three) times daily as needed for anxiety.   citalopram 20 MG tablet Commonly known as: CELEXA Take 1 tablet (20 mg total) by mouth daily.   hydrochlorothiazide 25 MG tablet Commonly known as: HYDRODIURIL Take 1 tablet (25 mg total) by mouth daily.   latanoprost 0.005 % ophthalmic  solution Commonly known as: XALATAN   pantoprazole 20 MG tablet Commonly known as: PROTONIX Take 1 tablet (20 mg total) by mouth daily.   pravastatin 40 MG tablet Commonly known as: PRAVACHOL Take 1 tablet (40 mg total) by mouth daily.   Trulicity 0.75 MG/0.5ML Sopn Generic drug: Dulaglutide Inject 0.75 mg into the skin once a week.   verapamil 180 MG CR tablet Commonly known as: CALAN-SR  Take 1 tablet (180 mg total) by mouth daily.        Meds ordered this encounter  Medications   pravastatin (PRAVACHOL) 40 MG tablet    Sig: Take 1 tablet (40 mg total) by mouth daily.    Dispense:  90 tablet    Refill:  3   citalopram (CELEXA) 20 MG tablet    Sig: Take 1 tablet (20 mg total) by mouth daily.    Dispense:  90 tablet    Refill:  3   hydrochlorothiazide (HYDRODIURIL) 25 MG tablet    Sig: Take 1 tablet (25 mg total) by mouth daily.    Dispense:  90 tablet    Refill:  3   pantoprazole (PROTONIX) 20 MG tablet    Sig: Take 1 tablet (20 mg total) by mouth daily.    Dispense:  90 tablet    Refill:  3   verapamil (CALAN-SR) 180 MG CR tablet    Sig: Take 1 tablet (180 mg total) by mouth daily.    Dispense:  90 tablet    Refill:  3   ALPRAZolam (XANAX) 1 MG tablet    Sig: Take 1 tablet (1 mg total) by mouth 3 (three) times daily as needed for anxiety.    Dispense:  90 tablet    Refill:  5      Follow-up: Return in about 6 months (around 07/13/2021).  Mechele Claude, M.D.

## 2021-02-03 DIAGNOSIS — M170 Bilateral primary osteoarthritis of knee: Secondary | ICD-10-CM | POA: Diagnosis not present

## 2021-02-23 ENCOUNTER — Ambulatory Visit (INDEPENDENT_AMBULATORY_CARE_PROVIDER_SITE_OTHER): Payer: Medicare Other | Admitting: Family Medicine

## 2021-02-23 ENCOUNTER — Other Ambulatory Visit: Payer: Self-pay

## 2021-02-23 ENCOUNTER — Encounter: Payer: Self-pay | Admitting: Family Medicine

## 2021-02-23 ENCOUNTER — Ambulatory Visit: Payer: Medicare Other | Admitting: Family Medicine

## 2021-02-23 VITALS — BP 106/64 | HR 70 | Temp 98.4°F | Ht 71.0 in | Wt 252.5 lb

## 2021-02-23 DIAGNOSIS — Z01818 Encounter for other preprocedural examination: Secondary | ICD-10-CM

## 2021-02-23 NOTE — Progress Notes (Signed)
   Pt is a 65 y.o. male who is here for preoperative clearance for a left total knee replacement.   1) High Risk Cardiac Conditions  1) Recent MI - No.  2) Decompensated Heart Failure - No.  3) Unstable angina - No.  4) Symptomatic arrythmia - No.  5) Sx Valvular Disease - No.  2) Intermediate Risk Factors - DM, CKD, CVA, CHF, CAD - No.  2) Functional Status - > 4 mets (Walk, run, climb stairs) Yes.  Rob Hickman Activity Status Index: 36.7  3) Surgery Specific Risk -    Intermediate (Carotid, Head and Neck, Orthopaedic )       4) Further Noninvasive evaluation -   1) EKG - Yes.     1) Hx of CVA, CAD, DM, CKD  2) Echo - No.   1) Worsening dyspnea   3) Stress Testing - Active Cardiac Disease - No.  5) Need for medical therapy - Beta Blocker, Statins indicated ? Yes.   On statin therapy  PE: Vitals:   02/23/21 1115  BP: 106/64  Pulse: 70  Temp: 98.4 F (36.9 C)   Physical Examination: General appearance - alert, well appearing, and in no distress, oriented to person, place, and time, and overweight Mental status - alert, oriented to person, place, and time, normal mood, behavior, speech, dress, motor activity, and thought processes Eyes - pupils equal and reactive, extraocular eye movements intact Ears - bilateral TM's and external ear canals normal Nose - normal and patent, no erythema, discharge or polyps Mouth - mucous membranes moist, pharynx normal without lesions Neck - supple, no significant adenopathy Chest - clear to auscultation, no wheezes, rales or rhonchi, symmetric air entry Heart - normal rate, regular rhythm, normal S1, S2, no murmurs, rubs, clicks or gallops Abdomen - soft, nontender, nondistended, no masses or organomegaly Back exam - full range of motion, no tenderness, palpable spasm or pain on motion Neurological - alert, oriented, normal speech, no focal findings or movement disorder noted Musculoskeletal - no joint tenderness, deformity or  swelling Extremities - peripheral pulses normal, no pedal edema, no clubbing or cyanosis Skin - normal coloration and turgor, no rashes, no suspicious skin lesions noted  Chad was seen today for pre-op exam.  Diagnoses and all orders for this visit:  Pre-op examination EKG sinus rhythm today. Labs pending as below. Will sign for clearance pending results and notify patient when paperwork is ready.  -     EKG 12-Lead -     BMP8+EGFR -     CBC with Differential/Platelet -     PT AND PTT   I have independently evaluated patient.  JAXXEN VOONG is a 65 y.o. male who is low risk for a intermediate risk surgery.  There are/ are not modifiable risk factors (smoking, etc).    No CXR (if asx/healthy/no resp issues don't need) Yes EKG (?h/o MI, CAD, etc; usually don't need for low risk sx) No PFTs (significant cardiopulm hx? OSA/OHS) No Echo (get if CHF and has not had in >1 year OR if worse HF symptoms) Discussed to hold verapamil the day of surgery. (Review meds: NO ACE-I/ARB day of surgery. OK to do BB or statin day of esp if vascular sx)   Marjorie Smolder, [facility]

## 2021-02-24 LAB — PT AND PTT
INR: 1 (ref 0.9–1.2)
Prothrombin Time: 10.3 s (ref 9.1–12.0)
aPTT: 28 s (ref 24–33)

## 2021-02-24 LAB — CBC WITH DIFFERENTIAL/PLATELET
Basophils Absolute: 0.1 10*3/uL (ref 0.0–0.2)
Basos: 1 %
EOS (ABSOLUTE): 0.2 10*3/uL (ref 0.0–0.4)
Eos: 3 %
Hematocrit: 42.5 % (ref 37.5–51.0)
Hemoglobin: 13.8 g/dL (ref 13.0–17.7)
Immature Grans (Abs): 0.1 10*3/uL (ref 0.0–0.1)
Immature Granulocytes: 1 %
Lymphocytes Absolute: 1.4 10*3/uL (ref 0.7–3.1)
Lymphs: 16 %
MCH: 29.7 pg (ref 26.6–33.0)
MCHC: 32.5 g/dL (ref 31.5–35.7)
MCV: 91 fL (ref 79–97)
Monocytes Absolute: 0.9 10*3/uL (ref 0.1–0.9)
Monocytes: 10 %
Neutrophils Absolute: 6.2 10*3/uL (ref 1.4–7.0)
Neutrophils: 69 %
Platelets: 262 10*3/uL (ref 150–450)
RBC: 4.65 x10E6/uL (ref 4.14–5.80)
RDW: 12.7 % (ref 11.6–15.4)
WBC: 8.9 10*3/uL (ref 3.4–10.8)

## 2021-02-24 LAB — BMP8+EGFR
BUN/Creatinine Ratio: 16 (ref 10–24)
BUN: 14 mg/dL (ref 8–27)
CO2: 26 mmol/L (ref 20–29)
Calcium: 9.7 mg/dL (ref 8.6–10.2)
Chloride: 102 mmol/L (ref 96–106)
Creatinine, Ser: 0.88 mg/dL (ref 0.76–1.27)
Glucose: 104 mg/dL — ABNORMAL HIGH (ref 65–99)
Potassium: 4.1 mmol/L (ref 3.5–5.2)
Sodium: 141 mmol/L (ref 134–144)
eGFR: 94 mL/min/{1.73_m2} (ref >59)

## 2021-02-24 NOTE — Progress Notes (Signed)
Read note to pt °

## 2021-03-08 ENCOUNTER — Ambulatory Visit: Payer: Medicare Other | Admitting: Family Medicine

## 2021-03-22 ENCOUNTER — Telehealth: Payer: Self-pay | Admitting: Family Medicine

## 2021-03-22 NOTE — Telephone Encounter (Signed)
  Prescription Request  03/22/2021  Is this a "Controlled Substance" medicine? no Have you seen your PCP in the last 2 weeks? 02/23/21 If YES, route message to pool  -  If NO, patient needs to be scheduled for appointment.  What is the name of the medication or equipment?blood pressure meds  Have you contacted your pharmacy to request a refill?   Which pharmacy would you like this sent to? [facility]   Patient notified that their request is being sent to the clinical staff for review and that they should receive a response within 2 business days.

## 2021-03-22 NOTE — Telephone Encounter (Signed)
I let pt know that he had a yrs worth of refills since July on file at [facility] and they are getting his refill ready for him

## 2021-04-14 DIAGNOSIS — H402212 Chronic angle-closure glaucoma, right eye, moderate stage: Secondary | ICD-10-CM | POA: Diagnosis not present

## 2021-04-18 DIAGNOSIS — M1712 Unilateral primary osteoarthritis, left knee: Secondary | ICD-10-CM | POA: Diagnosis not present

## 2021-04-26 DIAGNOSIS — M25462 Effusion, left knee: Secondary | ICD-10-CM | POA: Diagnosis not present

## 2021-04-26 DIAGNOSIS — M1712 Unilateral primary osteoarthritis, left knee: Secondary | ICD-10-CM | POA: Diagnosis not present

## 2021-04-26 DIAGNOSIS — S83232A Complex tear of medial meniscus, current injury, left knee, initial encounter: Secondary | ICD-10-CM | POA: Diagnosis not present

## 2021-04-26 DIAGNOSIS — Z01818 Encounter for other preprocedural examination: Secondary | ICD-10-CM | POA: Diagnosis not present

## 2021-04-26 DIAGNOSIS — M170 Bilateral primary osteoarthritis of knee: Secondary | ICD-10-CM | POA: Diagnosis not present

## 2021-05-05 DIAGNOSIS — M1712 Unilateral primary osteoarthritis, left knee: Secondary | ICD-10-CM | POA: Diagnosis not present

## 2021-05-05 DIAGNOSIS — Z01818 Encounter for other preprocedural examination: Secondary | ICD-10-CM | POA: Diagnosis not present

## 2021-05-05 DIAGNOSIS — M25562 Pain in left knee: Secondary | ICD-10-CM | POA: Diagnosis not present

## 2021-05-05 DIAGNOSIS — M25561 Pain in right knee: Secondary | ICD-10-CM | POA: Diagnosis not present

## 2021-05-05 DIAGNOSIS — M170 Bilateral primary osteoarthritis of knee: Secondary | ICD-10-CM | POA: Diagnosis not present

## 2021-05-10 ENCOUNTER — Telehealth: Payer: Self-pay | Admitting: Family Medicine

## 2021-05-11 ENCOUNTER — Telehealth: Payer: Self-pay | Admitting: Family Medicine

## 2021-05-11 ENCOUNTER — Encounter: Payer: Self-pay | Admitting: Family Medicine

## 2021-05-11 ENCOUNTER — Ambulatory Visit (INDEPENDENT_AMBULATORY_CARE_PROVIDER_SITE_OTHER): Payer: Medicare Other | Admitting: Family Medicine

## 2021-05-11 DIAGNOSIS — N3001 Acute cystitis with hematuria: Secondary | ICD-10-CM | POA: Diagnosis not present

## 2021-05-11 MED ORDER — DOXYCYCLINE HYCLATE 100 MG PO TABS
100.0000 mg | ORAL_TABLET | Freq: Two times a day (BID) | ORAL | 0 refills | Status: AC
Start: 2021-05-11 — End: 2021-05-21

## 2021-05-11 MED ORDER — DOXYCYCLINE HYCLATE 100 MG PO TABS: 100.0000 mg | ORAL_TABLET | Freq: Two times a day (BID) | ORAL | 0 refills | Status: DC

## 2021-05-11 NOTE — Telephone Encounter (Signed)
Pt would like to switch from [facility] to [facility]. Please call back and let him know when a decision is made.

## 2021-05-11 NOTE — Telephone Encounter (Signed)
Fine with me

## 2021-05-11 NOTE — Progress Notes (Signed)
   Virtual Visit  Note Due to COVID-19 pandemic this visit was conducted virtually. This visit type was conducted due to national recommendations for restrictions regarding the COVID-19 Pandemic (e.g. social distancing, sheltering in place) in an effort to limit this patient's exposure and mitigate transmission in our community. All issues noted in this document were discussed and addressed.  A physical exam was not performed with this format.  I connected with Mark Sharp on 05/11/21 at 1256 by telephone and verified that I am speaking with the correct person using two identifiers. ISSIAH Sharp is currently located at home and no one is currently with him during the visit. The provider, Gabriel Earing, FNP is located in their office at time of visit.  I discussed the limitations, risks, security and privacy concerns of performing an evaluation and management service by telephone and the availability of in person appointments. I also discussed with the patient that there may be a patient responsible charge related to this service. The patient expressed understanding and agreed to proceed.  CC: UTI  History and Present Illness:  HPI Mark Sharp reports that he was seen for pre-op testing at [facility] and was told that he had a UTI and needed to be seen for treatment with antibiotics. He repots some hesitancy and frequency for a few months. He denies hematuria, fever, chills, or flank pain. He has never had a UTI before.     ROS As per HPI.    Observations/Objective: Alert and oriented x 3. Able to speak in full sentences without difficulty.   Reviewed UA results from [facility] on 05/05/21: positive nitrates, moderate leuks, trace blood  Culture positive for e.coli  Assessment and Plan: [name] was seen today for urinary tract infection.  Diagnoses and all orders for this visit:  Acute cystitis with hematuria Doxycyline as below. Return to office for new or worsening symptoms, or if symptoms persist.   -     doxycycline (VIBRA-TABS) 100 MG tablet; Take 1 tablet (100 mg total) by mouth 2 (two) times daily for 10 days. 1 po bid    Follow Up Instructions: As needed.     I discussed the assessment and treatment plan with the patient. The patient was provided an opportunity to ask questions and all were answered. The patient agreed with the plan and demonstrated an understanding of the instructions.   The patient was advised to call back or seek an in-person evaluation if the symptoms worsen or if the condition fails to improve as anticipated.  The above assessment and management plan was discussed with the patient. The patient verbalized understanding of and has agreed to the management plan. Patient is aware to call the clinic if symptoms persist or worsen. Patient is aware when to return to the clinic for a follow-up visit. Patient educated on when it is appropriate to go to the emergency department.   Time call ended:  1310  I provided 14 minutes of  non face-to-face time during this encounter.    Gabriel Earing, FNP

## 2021-05-11 NOTE — Telephone Encounter (Signed)
okay

## 2021-07-12 ENCOUNTER — Ambulatory Visit: Payer: Medicare Other | Admitting: Family Medicine

## 2022-01-09 ENCOUNTER — Telehealth: Payer: Self-pay | Admitting: Family Medicine

## 2022-01-09 ENCOUNTER — Other Ambulatory Visit: Payer: Self-pay | Admitting: *Deleted

## 2022-01-09 DIAGNOSIS — E7800 Pure hypercholesterolemia, unspecified: Secondary | ICD-10-CM

## 2022-01-09 DIAGNOSIS — K219 Gastro-esophageal reflux disease without esophagitis: Secondary | ICD-10-CM

## 2022-01-09 DIAGNOSIS — F419 Anxiety disorder, unspecified: Secondary | ICD-10-CM

## 2022-01-09 DIAGNOSIS — I10 Essential (primary) hypertension: Secondary | ICD-10-CM

## 2022-01-09 MED ORDER — HYDROCHLOROTHIAZIDE 25 MG PO TABS: 25.0000 mg | ORAL_TABLET | Freq: Every day | ORAL | 0 refills | Status: DC

## 2022-01-09 MED ORDER — PANTOPRAZOLE SODIUM 20 MG PO TBEC: 20.0000 mg | DELAYED_RELEASE_TABLET | Freq: Every day | ORAL | 0 refills | Status: DC

## 2022-01-09 MED ORDER — PRAVASTATIN SODIUM 40 MG PO TABS: 40.0000 mg | ORAL_TABLET | Freq: Every day | ORAL | 0 refills | Status: DC

## 2022-01-09 MED ORDER — CITALOPRAM HYDROBROMIDE 20 MG PO TABS: 20.0000 mg | ORAL_TABLET | Freq: Every day | ORAL | 0 refills | Status: DC

## 2022-01-09 MED ORDER — VERAPAMIL HCL ER 180 MG PO TBCR: 180.0000 mg | EXTENDED_RELEASE_TABLET | Freq: Every day | ORAL | 0 refills | Status: DC

## 2022-01-09 NOTE — Telephone Encounter (Signed)
Authorize 30 days only. Then contact the patient letting them know that they will need an appointment before any further prescriptions can be sent in  Okay to switch to [facility]

## 2022-01-09 NOTE — Telephone Encounter (Signed)
Last chronic check up 01/10/21- please review and advise

## 2022-01-09 NOTE — Telephone Encounter (Signed)
  Prescription Request  01/09/2022   What is the name of the medication or equipment? ALL meds except xanax  Have you contacted your pharmacy to request a refill? YES  Which pharmacy would you like this sent to? [facility] [place]  Pt almost out of his meds. Wants to know if Dr Darlyn Read can send in refill to last him until he can come in for an appt. Pt is aware that he is past due for an appt and we cant refill his xanax until he is seen.  Pt also wants to know if he can switch providers. Pt says he saw Harlow Mares at his last visit and really liked her and would like her to be his PCP. Please advise and call patient.

## 2022-01-09 NOTE — Telephone Encounter (Signed)
Patient aware.

## 2022-01-11 ENCOUNTER — Ambulatory Visit (INDEPENDENT_AMBULATORY_CARE_PROVIDER_SITE_OTHER): Payer: Medicare Other

## 2022-01-11 VITALS — Wt 252.0 lb

## 2022-01-11 DIAGNOSIS — Z0000 Encounter for general adult medical examination without abnormal findings: Secondary | ICD-10-CM | POA: Diagnosis not present

## 2022-01-11 NOTE — Progress Notes (Signed)
Subjective:   Mark Sharp is a 66 y.o. male who presents for Medicare Annual/Subsequent preventive examination.  Virtual Visit via Telephone Note  I connected with  Mark Sharp on 01/11/22 at 11:15 AM EDT by telephone and verified that I am speaking with the correct person using two identifiers.  Location: Patient: Home Provider: [facility] Persons participating in the virtual visit: patient/Nurse Health Advisor   I discussed the limitations, risks, security and privacy concerns of performing an evaluation and management service by telephone and the availability of in person appointments. The patient expressed understanding and agreed to proceed.  Interactive audio and video telecommunications were attempted between this nurse and patient, however failed, due to patient having technical difficulties OR patient did not have access to video capability.  We continued and completed visit with audio only.  Some vital signs may be absent or patient reported.   Dana Dorner E Jarell Mcewen, LPN   Review of Systems     Cardiac Risk Factors include: advanced age (>28men, >7 women);dyslipidemia;hypertension;male gender;sedentary lifestyle;obesity (BMI >30kg/m2)     Objective:    Today's Vitals   01/11/22 1120  Weight: 252 lb (114.3 kg)  PainSc: 6    Body mass index is 35.15 kg/m.     01/11/2022   11:27 AM 01/07/2021    9:57 AM  Advanced Directives  Does Patient Have a Medical Advance Directive? No No  Would patient like information on creating a medical advance directive? No - Patient declined No - Guardian declined    Current Medications (verified) Outpatient Encounter Medications as of 01/11/2022  Medication Sig   ALPRAZolam (XANAX) 1 MG tablet Take 1 tablet (1 mg total) by mouth 3 (three) times daily as needed for anxiety.   citalopram (CELEXA) 20 MG tablet Take 1 tablet (20 mg total) by mouth daily.   hydrochlorothiazide (HYDRODIURIL) 25 MG tablet Take 1 tablet (25 mg total) by mouth daily.    latanoprost (XALATAN) 0.005 % ophthalmic solution    pantoprazole (PROTONIX) 20 MG tablet Take 1 tablet (20 mg total) by mouth daily.   pravastatin (PRAVACHOL) 40 MG tablet Take 1 tablet (40 mg total) by mouth daily.   verapamil (CALAN-SR) 180 MG CR tablet Take 1 tablet (180 mg total) by mouth daily.   No facility-administered encounter medications on file as of 01/11/2022.    Allergies (verified) Atorvastatin, Rosuvastatin calcium, Valsartan, and Penicillins   History: Past Medical History:  Diagnosis Date   Anxiety    Depression    GERD (gastroesophageal reflux disease)    Glaucoma    Hyperlipidemia    Hypertension    Past Surgical History:  Procedure Laterality Date   EYE SURGERY     SPINE SURGERY     Family History  Problem Relation Age of Onset   Arthritis Mother    Cancer Mother        breast   Diabetes Mother    Endometrial cancer Mother    Social History   Socioeconomic History   Marital status: Divorced    Spouse name: Not on file   Number of children: 1   Years of education: Not on file   Highest education level: 12th grade  Occupational History   Occupation: RETIRED  Tobacco Use   Smoking status: Former    Types: Cigarettes    Quit date: 1990    Years since quitting: 33.5   Smokeless tobacco: Never  Vaping Use   Vaping Use: Never used  Substance and Sexual Activity  Alcohol use: Not Currently    Comment: occasional   Drug use: No   Sexual activity: Not Currently  Other Topics Concern   Not on file  Social History Narrative   Lives alone. Enjoys kayaking and fishing. One daughter. One dog.    Social Determinants of Health   Financial Resource Strain: Low Risk  (01/11/2022)   Overall Financial Resource Strain (CARDIA)    Difficulty of Paying Living Expenses: Not hard at all  Food Insecurity: No Food Insecurity (01/11/2022)   Hunger Vital Sign    Worried About Running Out of Food in the Last Year: Never true    Ran Out of Food in the Last  Year: Never true  Transportation Needs: No Transportation Needs (01/11/2022)   PRAPARE - Administrator, Civil Service (Medical): No    Lack of Transportation (Non-Medical): No  Physical Activity: Unknown (01/11/2022)   Exercise Vital Sign    Days of Exercise per Week: Not on file    Minutes of Exercise per Session: 20 min  Stress: No Stress Concern Present (01/11/2022)   [employer] of Occupational Health - Occupational Stress Questionnaire    Feeling of Stress : Only a little  Social Connections: Moderately Integrated (01/11/2022)   Social Connection and Isolation Panel [NHANES]    Frequency of Communication with Friends and Family: More than three times a week    Frequency of Social Gatherings with Friends and Family: More than three times a week    Attends Religious Services: More than 4 times per year    Active Member of [employer] or Organizations: Yes    Attends Engineer, structural: More than 4 times per year    Marital Status: Divorced    Tobacco Counseling Counseling given: Not Answered   Clinical Intake:  Pre-visit preparation completed: Yes  Pain : 0-10 Pain Score: 6  Pain Type: Chronic pain Pain Location: Knee Pain Orientation: Left Pain Descriptors / Indicators: Aching, Tightness Pain Onset: More than a month ago Pain Frequency: Intermittent Effect of Pain on Daily Activities: Aleve     BMI - recorded: 35.15 Nutritional Status: BMI > 30  Obese Nutritional Risks: None Diabetes: No  How often do you need to have someone help you when you read instructions, pamphlets, or other written materials from your doctor or pharmacy?: 1 - Never  Diabetic? no  Interpreter Needed?: No  Information entered by :: Teaghan Melrose, LPN   Activities of Daily Living    01/11/2022   11:27 AM  In your present state of health, do you have any difficulty performing the following activities:  Hearing? 0  Vision? 0  Difficulty concentrating or making  decisions? 0  Walking or climbing stairs? 0  Dressing or bathing? 0  Doing errands, shopping? 0  Preparing Food and eating ? N  Using the Toilet? N  In the past six months, have you accidently leaked urine? N  Do you have problems with loss of bowel control? N  Managing your Medications? N  Managing your Finances? N  Housekeeping or managing your Housekeeping? N    Patient Care Team: Mechele Claude, MD as PCP - General (Family Medicine)  Indicate any recent Medical Services you may have received from other than [facility] providers in the past year (date may be approximate).     Assessment:   This is a routine wellness examination for Mark Sharp.  Hearing/Vision screen Hearing Screening - Comments:: Denies hearing difficulties   Vision Screening - Comments::  Wears rx glasses - up to date with routine eye exams with [facility]  Dietary issues and exercise activities discussed: Current Exercise Habits: Home exercise routine, Type of exercise: walking;Other - see comments (yard and house work), Time (Minutes): 20, Frequency (Times/Week): 7, Weekly Exercise (Minutes/Week): 140, Intensity: Mild, Exercise limited by: orthopedic condition(s)   Goals Addressed             This Visit's Progress    Patient Stated       01/11/2022 AWV Goal: Fall Prevention  Over the next year, patient will decrease their risk for falls by: Using assistive devices, such as a cane or walker, as needed Identifying fall risks within their home and correcting them by: Removing throw rugs Adding handrails to stairs or ramps Removing clutter and keeping a clear pathway throughout the home Increasing light, especially at night Adding shower handles/bars Raising toilet seat Identifying potential personal risk factors for falls: Medication side effects Incontinence/urgency Vestibular dysfunction Hearing loss Musculoskeletal disorders Neurological disorders Orthostatic hypotension         Depression  Screen    01/11/2022   11:25 AM 02/23/2021   11:12 AM 01/10/2021    8:57 AM 01/10/2021    8:50 AM 01/07/2021    9:58 AM 09/01/2020   10:11 AM 01/21/2020    2:49 PM  PHQ 2/9 Scores  PHQ - 2 Score 2 3 3  0 0 0 0  PHQ- 9 Score 3 4 6         Fall Risk    01/11/2022   11:23 AM 02/23/2021   11:12 AM 01/10/2021    8:50 AM 01/07/2021    9:57 AM 09/01/2020   10:11 AM  Fall Risk   Falls in the past year? 0 0 1 1 0  Number falls in past yr: 0  1 1   Injury with Fall? 0  0 0   Risk for fall due to : Orthopedic patient;Impaired balance/gait  History of fall(s);Orthopedic patient History of fall(s);Orthopedic patient   Risk for fall due to: Comment    knee problems   Follow up Falls prevention discussed  Falls evaluation completed Falls evaluation completed     FALL RISK PREVENTION PERTAINING TO THE HOME:  Any stairs in or around the home? No  If so, are there any without handrails? No  Home free of loose throw rugs in walkways, pet beds, electrical cords, etc? Yes  Adequate lighting in your home to reduce risk of falls? Yes   ASSISTIVE DEVICES UTILIZED TO PREVENT FALLS:  Life alert? No  Use of a cane, walker or w/c?  Cane prn Grab bars in the bathroom? No  Shower chair or bench in shower? No  Elevated toilet seat or a handicapped toilet? No   TIMED UP AND GO:  Was the test performed? No . Telephonic visit  Cognitive Function:        01/11/2022   11:29 AM 01/07/2021   10:00 AM  6CIT Screen  What Year? 0 points 0 points  What month? 0 points 0 points  What time? 0 points 0 points  Count back from 20 0 points 0 points  Months in reverse 0 points 0 points  Repeat phrase 4 points 2 points  Total Score 4 points 2 points    Immunizations Immunization History  Administered Date(s) Administered   Fluad Quad(high Dose 65+) 09/01/2020   Influenza Inj Mdck Quad Pf 04/03/2017   Influenza-Unspecified 04/03/2015, 03/26/2018, 04/14/2019   Moderna Sars-Covid-2 Vaccination 09/08/2019,  10/06/2019, 05/07/2020   Pneumococcal Conjugate-13 09/01/2020   Tdap 12/21/2020   Zoster Recombinat (Shingrix) 12/21/2020, 02/22/2021    TDAP status: Up to date  Flu Vaccine status: Due, Education has been provided regarding the importance of this vaccine. Advised may receive this vaccine at local pharmacy or Health Dept. Aware to provide a copy of the vaccination record if obtained from local pharmacy or Health Dept. Verbalized acceptance and understanding.  Pneumococcal vaccine status: Due, Education has been provided regarding the importance of this vaccine. Advised may receive this vaccine at local pharmacy or Health Dept. Aware to provide a copy of the vaccination record if obtained from local pharmacy or Health Dept. Verbalized acceptance and understanding.  Covid-19 vaccine status: Completed vaccines  Qualifies for Shingles Vaccine? Yes   Zostavax completed Yes   Shingrix Completed?: Yes  Screening Tests Health Maintenance  Topic Date Due   COLONOSCOPY (Pts 45-32yrs Insurance coverage will need to be confirmed)  Never done   COVID-19 Vaccine (4 - Booster for Moderna series) 07/02/2020   Pneumonia Vaccine 86+ Years old (2 - PPSV23 or PCV20) 09/01/2021   INFLUENZA VACCINE  01/31/2022   TETANUS/TDAP  12/22/2030   Hepatitis C Screening  Completed   Zoster Vaccines- Shingrix  Completed   HPV VACCINES  Aged Out    Health Maintenance  Health Maintenance Due  Topic Date Due   COLONOSCOPY (Pts 45-22yrs Insurance coverage will need to be confirmed)  Never done   COVID-19 Vaccine (4 - Booster for Moderna series) 07/02/2020   Pneumonia Vaccine 59+ Years old (2 - PPSV23 or PCV20) 09/01/2021    DECLINES COLONOSCOPY  Lung Cancer Screening: (Low Dose CT Chest recommended if Age 41-80 years, 30 pack-year currently smoking OR have quit w/in 15years.) does not qualify.   Additional Screening:  Hepatitis C Screening: does qualify; Completed 09/01/2020  Vision Screening: Recommended  annual ophthalmology exams for early detection of glaucoma and other disorders of the eye. Is the patient up to date with their annual eye exam?  Yes  Who is the provider or what is the name of the office in which the patient attends annual eye exams? [facility] If pt is not established with a provider, would they like to be referred to a provider to establish care? No .   Dental Screening: Recommended annual dental exams for proper oral hygiene  Community Resource Referral / Chronic Care Management: CRR required this visit?  No   CCM required this visit?  No      Plan:     I have personally reviewed and noted the following in the patient's chart:   Medical and social history Use of alcohol, tobacco or illicit drugs  Current medications and supplements including opioid prescriptions. Patient is not currently taking opioid prescriptions. Functional ability and status Nutritional status Physical activity Advanced directives List of other physicians Hospitalizations, surgeries, and ER visits in previous 12 months Vitals Screenings to include cognitive, depression, and falls Referrals and appointments  In addition, I have reviewed and discussed with patient certain preventive protocols, quality metrics, and best practice recommendations. A written personalized care plan for preventive services as well as general preventive health recommendations were provided to patient.     [place] Constable, LPN   08/12/4707   Nurse Notes: None

## 2022-01-11 NOTE — Patient Instructions (Signed)
Mark Sharp , Thank you for taking time to come for your Medicare Wellness Visit. I appreciate your ongoing commitment to your health goals. Please review the following plan we discussed and let me know if I can assist you in the future.   Screening recommendations/referrals: Colonoscopy: Declined Recommended yearly ophthalmology/optometry visit for glaucoma screening and checkup Recommended yearly dental visit for hygiene and checkup  Vaccinations: Influenza vaccine: Done 09/01/2020 - recommended every fall Pneumococcal vaccine: Prevnar Done 09/01/2020 - Due for Pneumovax-23 Tdap vaccine: Done 12/21/2020 - Repeat in 10 years Shingles vaccine: Done 12/21/2020 & 02/22/2021   Covid-19: Done 09/08/2019, 10/06/2019, & 05/07/2020  Advanced directives: Advance directive discussed with you today. Even though you declined this today, please call our office should you change your mind, and we can give you the proper paperwork for you to fill out.   Conditions/risks identified: Aim for 30 minutes of exercise or brisk walking, 6-8 glasses of water, and 5 servings of fruits and vegetables each day.   Next appointment: Follow up in one year for your annual wellness visit.   Preventive Care 68 Years and Older, Male  Preventive care refers to lifestyle choices and visits with your health care provider that can promote health and wellness. What does preventive care include? A yearly physical exam. This is also called an annual well check. Dental exams once or twice a year. Routine eye exams. Ask your health care provider how often you should have your eyes checked. Personal lifestyle choices, including: Daily care of your teeth and gums. Regular physical activity. Eating a healthy diet. Avoiding tobacco and drug use. Limiting alcohol use. Practicing safe sex. Taking low doses of aspirin every day. Taking vitamin and mineral supplements as recommended by your health care provider. What happens during an annual  well check? The services and screenings done by your health care provider during your annual well check will depend on your age, overall health, lifestyle risk factors, and family history of disease. Counseling  Your health care provider may ask you questions about your: Alcohol use. Tobacco use. Drug use. Emotional well-being. Home and relationship well-being. Sexual activity. Eating habits. History of falls. Memory and ability to understand (cognition). Work and work Astronomer. Screening  You may have the following tests or measurements: Height, weight, and BMI. Blood pressure. Lipid and cholesterol levels. These may be checked every 5 years, or more frequently if you are over 68 years old. Skin check. Lung cancer screening. You may have this screening every year starting at age 68 if you have a 30-pack-year history of smoking and currently smoke or have quit within the past 15 years. Fecal occult blood test (FOBT) of the stool. You may have this test every year starting at age 68. Flexible sigmoidoscopy or colonoscopy. You may have a sigmoidoscopy every 5 years or a colonoscopy every 10 years starting at age 68. Prostate cancer screening. Recommendations will vary depending on your family history and other risks. Hepatitis C blood test. Hepatitis B blood test. Sexually transmitted disease (STD) testing. Diabetes screening. This is done by checking your blood sugar (glucose) after you have not eaten for a while (fasting). You may have this done every 1-3 years. Abdominal aortic aneurysm (AAA) screening. You may need this if you are a current or former smoker. Osteoporosis. You may be screened starting at age 68 if you are at high risk. Talk with your health care provider about your test results, treatment options, and if necessary, the need for more  tests. Vaccines  Your health care provider may recommend certain vaccines, such as: Influenza vaccine. This is recommended every  year. Tetanus, diphtheria, and acellular pertussis (Tdap, Td) vaccine. You may need a Td booster every 10 years. Zoster vaccine. You may need this after age 68. Pneumococcal 13-valent conjugate (PCV13) vaccine. One dose is recommended after age 68. Pneumococcal polysaccharide (PPSV23) vaccine. One dose is recommended after age 68. Talk to your health care provider about which screenings and vaccines you need and how often you need them. This information is not intended to replace advice given to you by your health care provider. Make sure you discuss any questions you have with your health care provider. Document Released: 07/16/2015 Document Revised: 03/08/2016 Document Reviewed: 04/20/2015 Elsevier Interactive Patient Education  2017 [place] Prevention in the Home Falls can cause injuries. They can happen to people of all ages. There are many things you can do to make your home safe and to help prevent falls. What can I do on the outside of my home? Regularly fix the edges of walkways and driveways and fix any cracks. Remove anything that might make you trip as you walk through a door, such as a raised step or threshold. Trim any bushes or trees on the path to your home. Use bright outdoor lighting. Clear any walking paths of anything that might make someone trip, such as rocks or tools. Regularly check to see if handrails are loose or broken. Make sure that both sides of any steps have handrails. Any raised decks and porches should have guardrails on the edges. Have any leaves, snow, or ice cleared regularly. Use sand or salt on walking paths during winter. Clean up any spills in your garage right away. This includes oil or grease spills. What can I do in the bathroom? Use night lights. Install grab bars by the toilet and in the tub and shower. Do not use towel bars as grab bars. Use non-skid mats or decals in the tub or shower. If you need to sit down in the shower, use a  plastic, non-slip stool. Keep the floor dry. Clean up any water that spills on the floor as soon as it happens. Remove soap buildup in the tub or shower regularly. Attach bath mats securely with double-sided non-slip rug tape. Do not have throw rugs and other things on the floor that can make you trip. What can I do in the bedroom? Use night lights. Make sure that you have a light by your bed that is easy to reach. Do not use any sheets or blankets that are too big for your bed. They should not hang down onto the floor. Have a firm chair that has side arms. You can use this for support while you get dressed. Do not have throw rugs and other things on the floor that can make you trip. What can I do in the kitchen? Clean up any spills right away. Avoid walking on wet floors. Keep items that you use a lot in easy-to-reach places. If you need to reach something above you, use a strong step stool that has a grab bar. Keep electrical cords out of the way. Do not use floor polish or wax that makes floors slippery. If you must use wax, use non-skid floor wax. Do not have throw rugs and other things on the floor that can make you trip. What can I do with my stairs? Do not leave any items on the stairs. Make sure  that there are handrails on both sides of the stairs and use them. Fix handrails that are broken or loose. Make sure that handrails are as long as the stairways. Check any carpeting to make sure that it is firmly attached to the stairs. Fix any carpet that is loose or worn. Avoid having throw rugs at the top or bottom of the stairs. If you do have throw rugs, attach them to the floor with carpet tape. Make sure that you have a light switch at the top of the stairs and the bottom of the stairs. If you do not have them, ask someone to add them for you. What else can I do to help prevent falls? Wear shoes that: Do not have high heels. Have rubber bottoms. Are comfortable and fit you  well. Are closed at the toe. Do not wear sandals. If you use a stepladder: Make sure that it is fully opened. Do not climb a closed stepladder. Make sure that both sides of the stepladder are locked into place. Ask someone to hold it for you, if possible. Clearly mark and make sure that you can see: Any grab bars or handrails. First and last steps. Where the edge of each step is. Use tools that help you move around (mobility aids) if they are needed. These include: Canes. Walkers. Scooters. Crutches. Turn on the lights when you go into a dark area. Replace any light bulbs as soon as they burn out. Set up your furniture so you have a clear path. Avoid moving your furniture around. If any of your floors are uneven, fix them. If there are any pets around you, be aware of where they are. Review your medicines with your doctor. Some medicines can make you feel dizzy. This can increase your chance of falling. Ask your doctor what other things that you can do to help prevent falls. This information is not intended to replace advice given to you by your health care provider. Make sure you discuss any questions you have with your health care provider. Document Released: 04/15/2009 Document Revised: 11/25/2015 Document Reviewed: 07/24/2014 Elsevier Interactive Patient Education  2017 [employer].

## 2022-02-08 DIAGNOSIS — H402212 Chronic angle-closure glaucoma, right eye, moderate stage: Secondary | ICD-10-CM | POA: Diagnosis not present

## 2022-03-20 ENCOUNTER — Other Ambulatory Visit: Payer: Self-pay | Admitting: *Deleted

## 2022-03-20 ENCOUNTER — Telehealth: Payer: Self-pay | Admitting: Family Medicine

## 2022-03-20 DIAGNOSIS — F419 Anxiety disorder, unspecified: Secondary | ICD-10-CM

## 2022-03-20 DIAGNOSIS — I10 Essential (primary) hypertension: Secondary | ICD-10-CM

## 2022-03-20 DIAGNOSIS — K219 Gastro-esophageal reflux disease without esophagitis: Secondary | ICD-10-CM

## 2022-03-20 MED ORDER — VERAPAMIL HCL ER 180 MG PO TBCR: 180.0000 mg | EXTENDED_RELEASE_TABLET | Freq: Every day | ORAL | 0 refills | Status: DC

## 2022-03-20 MED ORDER — HYDROCHLOROTHIAZIDE 25 MG PO TABS: 25.0000 mg | ORAL_TABLET | Freq: Every day | ORAL | 0 refills | Status: DC

## 2022-03-20 MED ORDER — PANTOPRAZOLE SODIUM 20 MG PO TBEC: 20.0000 mg | DELAYED_RELEASE_TABLET | Freq: Every day | ORAL | 0 refills | Status: DC

## 2022-03-20 MED ORDER — CITALOPRAM HYDROBROMIDE 20 MG PO TABS: 20.0000 mg | ORAL_TABLET | Freq: Every day | ORAL | 0 refills | Status: DC

## 2022-03-20 NOTE — Telephone Encounter (Signed)
ONE MONTH REFILL GIVEN 

## 2022-03-20 NOTE — Telephone Encounter (Signed)
  Prescription Request  03/20/2022  Is this a "Controlled Substance" medicine? no  Have you seen your PCP in the last 2 weeks? No pt has appt with Tiffany on 03/27/2022  If YES, route message to pool  -  If NO, patient needs to be scheduled for appointment.  What is the name of the medication or equipment? verapamil (CALAN-SR) 180 MG CR tablet  citalopram (CELEXA) 20 MG tablet hydrochlorothiazide (HYDRODIURIL) 25 MG tablet pantoprazole (PROTONIX) 20 MG tablet   Have you contacted your pharmacy to request a refill? yes   Which pharmacy would you like this sent to? [facility]    Patient notified that their request is being sent to the clinical staff for review and that they should receive a response within 2 business days.

## 2022-03-27 ENCOUNTER — Ambulatory Visit (INDEPENDENT_AMBULATORY_CARE_PROVIDER_SITE_OTHER): Payer: Medicare Other | Admitting: Family Medicine

## 2022-03-27 ENCOUNTER — Encounter: Payer: Self-pay | Admitting: Family Medicine

## 2022-03-27 VITALS — BP 138/70 | HR 71 | Temp 98.1°F | Ht 71.0 in | Wt 242.0 lb

## 2022-03-27 DIAGNOSIS — R7301 Impaired fasting glucose: Secondary | ICD-10-CM | POA: Diagnosis not present

## 2022-03-27 DIAGNOSIS — E7800 Pure hypercholesterolemia, unspecified: Secondary | ICD-10-CM | POA: Diagnosis not present

## 2022-03-27 DIAGNOSIS — Z23 Encounter for immunization: Secondary | ICD-10-CM

## 2022-03-27 DIAGNOSIS — I10 Essential (primary) hypertension: Secondary | ICD-10-CM | POA: Diagnosis not present

## 2022-03-27 DIAGNOSIS — K219 Gastro-esophageal reflux disease without esophagitis: Secondary | ICD-10-CM

## 2022-03-27 DIAGNOSIS — F419 Anxiety disorder, unspecified: Secondary | ICD-10-CM

## 2022-03-27 LAB — BAYER DCA HB A1C WAIVED: HB A1C (BAYER DCA - WAIVED): 5.7 % — ABNORMAL HIGH (ref 4.8–5.6)

## 2022-03-27 MED ORDER — VERAPAMIL HCL ER 180 MG PO TBCR: 180.0000 mg | EXTENDED_RELEASE_TABLET | Freq: Every day | ORAL | 1 refills | Status: DC

## 2022-03-27 MED ORDER — PANTOPRAZOLE SODIUM 20 MG PO TBEC: 20.0000 mg | DELAYED_RELEASE_TABLET | Freq: Every day | ORAL | 1 refills | Status: DC

## 2022-03-27 MED ORDER — HYDROCHLOROTHIAZIDE 25 MG PO TABS: 25.0000 mg | ORAL_TABLET | Freq: Every day | ORAL | 1 refills | Status: DC

## 2022-03-27 MED ORDER — ALPRAZOLAM 1 MG PO TABS: 1.0000 mg | ORAL_TABLET | Freq: Three times a day (TID) | ORAL | 0 refills | Status: DC | PRN

## 2022-03-27 MED ORDER — PRAVASTATIN SODIUM 40 MG PO TABS: 40.0000 mg | ORAL_TABLET | Freq: Every day | ORAL | 1 refills | Status: DC

## 2022-03-27 MED ORDER — CITALOPRAM HYDROBROMIDE 20 MG PO TABS: 20.0000 mg | ORAL_TABLET | Freq: Every day | ORAL | 1 refills | Status: DC

## 2022-03-27 NOTE — Progress Notes (Signed)
Established Patient Office Visit  Subjective   Patient ID: Mark Sharp, male    DOB: [birth year]  Age: 66 y.o. MRN: [id number]  Chief Complaint  Patient presents with   Medical Management of Chronic Issues    HPI Naftula is here for a chronic follow up. He is a patient of Dr. Livia Snellen. He was hoping to switch providers due to scheduling issues. Unfortunately he is on a controlled substance and this was not approved by me prior to his appointment today. He takes xanax 2-3x a day. He has been taking this for years and reprots that this works well for him. He also takes celexa daily.   He has been working on weight loss. He has lost 20 lbs. He has cut out down on carbs and sugar.   He needs a refill on all medications today, including his xanax.       03/27/2022   10:30 AM 01/11/2022   11:25 AM 02/23/2021   11:12 AM  Depression screen PHQ 2/9  Decreased Interest 0 1 2  Down, Depressed, Hopeless 0 1 1  PHQ - 2 Score 0 2 3  Altered sleeping 0 0 0  Tired, decreased energy 0 1 1  Change in appetite 0 0 0  Feeling bad or failure about yourself  0 0 0  Trouble concentrating 0 0 0  Moving slowly or fidgety/restless 0 0 0  Suicidal thoughts 0 0 0  PHQ-9 Score 0 3 4  Difficult doing work/chores Not difficult at all Somewhat difficult Somewhat difficult      02/23/2021   11:15 AM 01/10/2021    8:57 AM  GAD 7 : Generalized Anxiety Score  Nervous, Anxious, on Edge 1 1  Control/stop worrying 0 0  Worry too much - different things 0 0  Trouble relaxing 0 0  Restless 0 0  Easily annoyed or irritable 0 0  Afraid - awful might happen 0 0  Total GAD 7 Score 1 1  Anxiety Difficulty Not difficult at all Somewhat difficult    Patient Active Problem List   Diagnosis Date Noted   Primary osteoarthritis of both knees 04/30/2018   Gastroesophageal reflux disease without esophagitis 10/26/2017   Primary osteoarthritis of right knee 01/01/2017   Essential hypertension 01/01/2017   Anxiety  01/01/2017   Pure hypercholesterolemia 01/01/2017   Glaucoma of both eyes 01/01/2017      Review of Systems  Constitutional:  Negative for chills, diaphoresis, fever and malaise/fatigue.  Eyes:  Negative for blurred vision, double vision and photophobia.  Respiratory:  Negative for sputum production, shortness of breath and wheezing.   Cardiovascular:  Negative for chest pain, palpitations and leg swelling.  Gastrointestinal:  Negative for abdominal pain and vomiting.  Neurological:  Negative for dizziness, sensory change, speech change, focal weakness, weakness and headaches.  Psychiatric/Behavioral:  Negative for depression and suicidal ideas. The patient is nervous/anxious.       Objective:     BP 138/70   Pulse 71   Temp 98.1 F (36.7 C) (Temporal)   Ht _0  (1.803 m)   Wt 242 lb (109.8 kg)   SpO2 96%   BMI 33.75 kg/m  BP Readings from Last 3 Encounters:  03/27/22 138/70  02/23/21 106/64  01/10/21 127/69   Wt Readings from Last 3 Encounters:  03/27/22 242 lb (109.8 kg)  01/11/22 252 lb (114.3 kg)  02/23/21 252 lb 8 oz (114.5 kg)     Physical Exam Vitals and nursing note  reviewed.  Constitutional:      General: He is not in acute distress.    Appearance: He is not ill-appearing, toxic-appearing or diaphoretic.  HENT:     Head: Normocephalic and atraumatic.     Right Ear: Tympanic membrane, ear canal and external ear normal.     Left Ear: Tympanic membrane, ear canal and external ear normal.     Nose: Nose normal.     Mouth/Throat:     Mouth: Mucous membranes are moist.     Pharynx: Oropharynx is clear.  Cardiovascular:     Rate and Rhythm: Normal rate and regular rhythm.     Heart sounds: Normal heart sounds. No murmur heard. Pulmonary:     Effort: Pulmonary effort is normal. No respiratory distress.     Breath sounds: Normal breath sounds.  Abdominal:     General: Bowel sounds are normal. There is no distension.     Palpations: Abdomen is soft.      Tenderness: There is no abdominal tenderness. There is no guarding or rebound.  Musculoskeletal:     Right lower leg: No edema.     Left lower leg: No edema.  Skin:    General: Skin is warm and dry.  Neurological:     General: No focal deficit present.     Mental Status: He is alert and oriented to person, place, and time.  Psychiatric:        Mood and Affect: Mood normal.        Behavior: Behavior normal.      No results found for any visits on 03/27/22.  Last CBC Lab Results  Component Value Date   WBC 8.9 02/23/2021   HGB 13.8 02/23/2021   HCT 42.5 02/23/2021   MCV 91 02/23/2021   MCH 29.7 02/23/2021   RDW 12.7 02/23/2021   PLT 262 53/64/6803   Last metabolic panel Lab Results  Component Value Date   GLUCOSE 104 (H) 02/23/2021   NA 141 02/23/2021   K 4.1 02/23/2021   CL 102 02/23/2021   CO2 26 02/23/2021   BUN 14 02/23/2021   CREATININE 0.88 02/23/2021   EGFR 94 02/23/2021   CALCIUM 9.7 02/23/2021   PROT 6.9 09/01/2020   ALBUMIN 3.9 09/01/2020   LABGLOB 3.0 09/01/2020   AGRATIO 1.3 09/01/2020   BILITOT 0.3 09/01/2020   ALKPHOS 136 (H) 09/01/2020   AST 28 09/01/2020   ALT 24 09/01/2020   Last lipids Lab Results  Component Value Date   CHOL 144 09/01/2020   HDL 30 (L) 09/01/2020   LDLCALC 93 09/01/2020   TRIG 116 09/01/2020   CHOLHDL 4.8 09/01/2020      The 10-year ASCVD risk score (Arnett DK, et al., 2019) is: 21.9%    Assessment & Plan:   Javan was seen today for medical management of chronic issues.  Diagnoses and all orders for this visit:  Primary hypertension Well controlled on current regimen. Refills provided.  -     CBC with Differential/Platelet -     CMP14+EGFR -     Lipid panel -     TSH -     hydrochlorothiazide (HYDRODIURIL) 25 MG tablet; Take 1 tablet (25 mg total) by mouth daily. -     verapamil (CALAN-SR) 180 MG CR tablet; Take 1 tablet (180 mg total) by mouth daily.  Gastroesophageal reflux disease without  esophagitis Well controlled on current regimen. Refills provided.  -     CBC with Differential/Platelet -  CMP14+EGFR -     Lipid panel -     pantoprazole (PROTONIX) 20 MG tablet; Take 1 tablet (20 mg total) by mouth daily.  Pure hypercholesterolemia Fasting labs pending. Refill provided.  -     CBC with Differential/Platelet -     CMP14+EGFR -     Lipid panel -     pravastatin (PRAVACHOL) 40 MG tablet; Take 1 tablet (40 mg total) by mouth daily.  Anxiety Well controlled on current regimen. Discussed that he is unable to switch provides due to the office's controlled substance policy. Unfortunately this was not told to the patient prior to being scheduled with him. I did provide a refill for this reason today. PDMP reviewed, no red flags. He will continue to follow up with PCP Dr. Livia Snellen.  -     ALPRAZolam (XANAX) 1 MG tablet; Take 1 tablet (1 mg total) by mouth 3 (three) times daily as needed for anxiety. -     citalopram (CELEXA) 20 MG tablet; Take 1 tablet (20 mg total) by mouth daily.  Elevated fasting glucose Will check A1c today. -     Bayer DCA Hb A1c Waived  Need for immunization against influenza Flu vaccine today.   Need for pneumococcal vaccine Prevnar 20 today.   Return in about 3 months (around 06/26/2022) for follow up with [facility]- unable to change providers due to CSA.   The patient indicates understanding of these issues and agrees with the plan.  Gwenlyn Perking, FNP

## 2022-03-28 LAB — CBC WITH DIFFERENTIAL/PLATELET
Basophils Absolute: 0 10*3/uL (ref 0.0–0.2)
Basos: 1 %
EOS (ABSOLUTE): 0.3 10*3/uL (ref 0.0–0.4)
Eos: 5 %
Hematocrit: 44.2 % (ref 37.5–51.0)
Hemoglobin: 14.3 g/dL (ref 13.0–17.7)
Immature Grans (Abs): 0.1 10*3/uL (ref 0.0–0.1)
Immature Granulocytes: 1 %
Lymphocytes Absolute: 1.8 10*3/uL (ref 0.7–3.1)
Lymphs: 29 %
MCH: 29.8 pg (ref 26.6–33.0)
MCHC: 32.4 g/dL (ref 31.5–35.7)
MCV: 92 fL (ref 79–97)
Monocytes Absolute: 0.5 10*3/uL (ref 0.1–0.9)
Monocytes: 8 %
Neutrophils Absolute: 3.5 10*3/uL (ref 1.4–7.0)
Neutrophils: 56 %
Platelets: 260 10*3/uL (ref 150–450)
RBC: 4.8 x10E6/uL (ref 4.14–5.80)
RDW: 13.6 % (ref 11.6–15.4)
WBC: 6.3 10*3/uL (ref 3.4–10.8)

## 2022-03-28 LAB — CMP14+EGFR
ALT: 22 IU/L (ref 0–44)
AST: 30 IU/L (ref 0–40)
Albumin/Globulin Ratio: 1.4 (ref 1.2–2.2)
Albumin: 4.2 g/dL (ref 3.9–4.9)
Alkaline Phosphatase: 151 IU/L — ABNORMAL HIGH (ref 44–121)
BUN/Creatinine Ratio: 23 (ref 10–24)
BUN: 21 mg/dL (ref 8–27)
Bilirubin Total: 0.3 mg/dL (ref 0.0–1.2)
CO2: 27 mmol/L (ref 20–29)
Calcium: 10 mg/dL (ref 8.6–10.2)
Chloride: 103 mmol/L (ref 96–106)
Creatinine, Ser: 0.9 mg/dL (ref 0.76–1.27)
Globulin, Total: 3 g/dL (ref 1.5–4.5)
Glucose: 123 mg/dL — ABNORMAL HIGH (ref 70–99)
Potassium: 4.3 mmol/L (ref 3.5–5.2)
Sodium: 141 mmol/L (ref 134–144)
Total Protein: 7.2 g/dL (ref 6.0–8.5)
eGFR: 93 mL/min/{1.73_m2} (ref >59)

## 2022-03-28 LAB — LIPID PANEL
Chol/HDL Ratio: 4.7 ratio (ref 0.0–5.0)
Cholesterol, Total: 151 mg/dL (ref 100–199)
HDL: 32 mg/dL — ABNORMAL LOW (ref >39)
LDL Chol Calc (NIH): 99 mg/dL (ref 0–99)
Triglycerides: 106 mg/dL (ref 0–149)
VLDL Cholesterol Cal: 20 mg/dL (ref 5–40)

## 2022-03-28 LAB — TSH: TSH: 1.97 u[IU]/mL (ref 0.450–4.500)

## 2022-06-13 NOTE — Progress Notes (Signed)
[facility] Quality Team Note  Name: Ranald T Abbruzzese Date of Birth: 10/26/1955 MRN: [id number] Date: 06/13/2022  [facility] Quality Team has reviewed this patient's chart, please see recommendations below:  [facility] Quality Other; (KED: Kidney Health Evaluation Gap- Patient needs Urine Albumin Creatinine Ratio Test completed for gap closure. EGFR has already been completed). 

## 2022-07-04 ENCOUNTER — Ambulatory Visit: Payer: Medicare Other | Admitting: Family Medicine

## 2022-08-03 ENCOUNTER — Encounter: Payer: Self-pay | Admitting: Family Medicine

## 2022-08-03 ENCOUNTER — Ambulatory Visit (INDEPENDENT_AMBULATORY_CARE_PROVIDER_SITE_OTHER): Payer: Medicare Other | Admitting: Family Medicine

## 2022-08-03 VITALS — BP 150/82 | HR 82 | Temp 98.0°F | Ht 71.0 in | Wt 228.4 lb

## 2022-08-03 DIAGNOSIS — F419 Anxiety disorder, unspecified: Secondary | ICD-10-CM

## 2022-08-03 DIAGNOSIS — I10 Essential (primary) hypertension: Secondary | ICD-10-CM

## 2022-08-03 DIAGNOSIS — Z79899 Other long term (current) drug therapy: Secondary | ICD-10-CM | POA: Diagnosis not present

## 2022-08-03 DIAGNOSIS — R7301 Impaired fasting glucose: Secondary | ICD-10-CM

## 2022-08-03 DIAGNOSIS — E7800 Pure hypercholesterolemia, unspecified: Secondary | ICD-10-CM | POA: Diagnosis not present

## 2022-08-03 LAB — BAYER DCA HB A1C WAIVED: HB A1C (BAYER DCA - WAIVED): 5.6 % (ref 4.8–5.6)

## 2022-08-03 MED ORDER — ALPRAZOLAM 1 MG PO TABS: 1.0000 mg | ORAL_TABLET | Freq: Three times a day (TID) | ORAL | 1 refills | Status: DC | PRN

## 2022-08-03 MED ORDER — PRAVASTATIN SODIUM 40 MG PO TABS: 40.0000 mg | ORAL_TABLET | Freq: Every day | ORAL | 3 refills | Status: DC

## 2022-08-03 MED ORDER — LISINOPRIL-HYDROCHLOROTHIAZIDE 10-12.5 MG PO TABS: 1.0000 | ORAL_TABLET | Freq: Every day | ORAL | 3 refills | Status: DC

## 2022-08-03 MED ORDER — CITALOPRAM HYDROBROMIDE 20 MG PO TABS: 20.0000 mg | ORAL_TABLET | Freq: Every day | ORAL | 3 refills | Status: AC

## 2022-08-03 MED ORDER — VERAPAMIL HCL ER 180 MG PO TBCR: 180.0000 mg | EXTENDED_RELEASE_TABLET | Freq: Every day | ORAL | 3 refills | Status: DC

## 2022-08-03 NOTE — Progress Notes (Signed)
Subjective:  Patient ID: Mark Sharp, male    DOB: [birth year]  Age: 67 y.o. MRN: [id number]  CC: Medical Management of Chronic Issues   HPI Mark Sharp presents for  follow-up of hypertension. Patient has no history of headache chest pain or shortness of breath or recent cough. Patient also denies symptoms of TIA such as focal numbness or weakness. Patient reports excessive urination from medication. States taking it regularly. Home BP 125/72.   Got rid of carbs, sugar and has lost 38 lb. Eating more healthy in general.    History Mark Sharp has a past medical history of Anxiety, Depression, GERD (gastroesophageal reflux disease), Glaucoma, Hyperlipidemia, and Hypertension.   He has a past surgical history that includes Eye surgery and Spine surgery.   His family history includes Arthritis in his mother; Cancer in his mother; Diabetes in his mother; Endometrial cancer in his mother.He reports that he quit smoking about 34 years ago. His smoking use included cigarettes. He has never used smokeless tobacco. He reports that he does not currently use alcohol. He reports that he does not use drugs.  Current Outpatient Medications on File Prior to Visit  Medication Sig Dispense Refill   latanoprost (XALATAN) 0.005 % ophthalmic solution      pantoprazole (PROTONIX) 20 MG tablet Take 1 tablet (20 mg total) by mouth daily. 90 tablet 1   No current facility-administered medications on file prior to visit.    ROS Review of Systems  Constitutional:  Negative for fever.  Respiratory:  Negative for shortness of breath.   Cardiovascular:  Negative for chest pain.  Musculoskeletal:  Negative for arthralgias.  Skin:  Negative for rash.    Objective:  BP (!) 150/82   Pulse 82   Temp 98 F (36.7 C)   Ht 5\' 11"  (1.803 m)   Wt 228 lb 6.4 oz (103.6 kg)   SpO2 95%   BMI 31.86 kg/m   BP Readings from Last 3 Encounters:  08/03/22 (!) 150/82  03/27/22 138/70  02/23/21 106/64    Wt Readings  from Last 3 Encounters:  08/03/22 228 lb 6.4 oz (103.6 kg)  03/27/22 242 lb (109.8 kg)  01/11/22 252 lb (114.3 kg)     Physical Exam Vitals reviewed.  Constitutional:      Appearance: He is well-developed.  HENT:     Head: Normocephalic and atraumatic.     Right Ear: External ear normal.     Left Ear: External ear normal.     Mouth/Throat:     Pharynx: No oropharyngeal exudate or posterior oropharyngeal erythema.  Eyes:     Pupils: Pupils are equal, round, and reactive to light.  Cardiovascular:     Rate and Rhythm: Normal rate and regular rhythm.     Heart sounds: No murmur heard. Pulmonary:     Effort: No respiratory distress.     Breath sounds: Normal breath sounds.  Musculoskeletal:     Cervical back: Normal range of motion and neck supple.  Neurological:     Mental Status: He is alert and oriented to person, place, and time.       Assessment & Plan:   Mark Sharp was seen today for medical management of chronic issues.  Diagnoses and all orders for this visit:  Essential hypertension -     CBC with Differential/Platelet -     CMP14+EGFR  Pure hypercholesterolemia -     Lipid panel -     pravastatin (PRAVACHOL) 40 MG tablet; Take  1 tablet (40 mg total) by mouth daily.  Elevated fasting glucose -     Bayer DCA Hb A1c Waived  Anxiety -     citalopram (CELEXA) 20 MG tablet; Take 1 tablet (20 mg total) by mouth daily. -     ALPRAZolam (XANAX) 1 MG tablet; Take 1 tablet (1 mg total) by mouth 3 (three) times daily as needed for anxiety.  Primary hypertension -     verapamil (CALAN-SR) 180 MG CR tablet; Take 1 tablet (180 mg total) by mouth daily.  Controlled substance agreement signed -     ToxASSURE Select 13 (MW), Urine  Other orders -     lisinopril-hydrochlorothiazide (ZESTORETIC) 10-12.5 MG tablet; Take 1 tablet by mouth daily. For blood pressure   Allergies as of 08/03/2022       Reactions   Atorvastatin Other (See Comments)   Rosuvastatin Calcium  Other (See Comments)   Valsartan Other (See Comments)   Penicillins Rash        Medication List        Accurate as of August 03, 2022  2:54 PM. If you have any questions, ask your nurse or doctor.          STOP taking these medications    hydrochlorothiazide 25 MG tablet Commonly known as: HYDRODIURIL Stopped by: Claretta Fraise, MD       TAKE these medications    ALPRAZolam 1 MG tablet Commonly known as: Xanax Take 1 tablet (1 mg total) by mouth 3 (three) times daily as needed for anxiety.   citalopram 20 MG tablet Commonly known as: CELEXA Take 1 tablet (20 mg total) by mouth daily.   latanoprost 0.005 % ophthalmic solution Commonly known as: XALATAN   lisinopril-hydrochlorothiazide 10-12.5 MG tablet Commonly known as: ZESTORETIC Take 1 tablet by mouth daily. For blood pressure Started by: Claretta Fraise, MD   pantoprazole 20 MG tablet Commonly known as: PROTONIX Take 1 tablet (20 mg total) by mouth daily.   pravastatin 40 MG tablet Commonly known as: PRAVACHOL Take 1 tablet (40 mg total) by mouth daily.   verapamil 180 MG CR tablet Commonly known as: CALAN-SR Take 1 tablet (180 mg total) by mouth daily.        Meds ordered this encounter  Medications   citalopram (CELEXA) 20 MG tablet    Sig: Take 1 tablet (20 mg total) by mouth daily.    Dispense:  90 tablet    Refill:  3   pravastatin (PRAVACHOL) 40 MG tablet    Sig: Take 1 tablet (40 mg total) by mouth daily.    Dispense:  90 tablet    Refill:  3   verapamil (CALAN-SR) 180 MG CR tablet    Sig: Take 1 tablet (180 mg total) by mouth daily.    Dispense:  90 tablet    Refill:  3   ALPRAZolam (XANAX) 1 MG tablet    Sig: Take 1 tablet (1 mg total) by mouth 3 (three) times daily as needed for anxiety.    Dispense:  90 tablet    Refill:  1   lisinopril-hydrochlorothiazide (ZESTORETIC) 10-12.5 MG tablet    Sig: Take 1 tablet by mouth daily. For blood pressure    Dispense:  90 tablet     Refill:  3    Check BP at home regularly. 130/80 is the limit. Notify me if this is not maintained while at rest.  Follow-up: Return in about 6 months (around 02/01/2023).  Claretta Fraise, M.D.

## 2022-08-04 LAB — LIPID PANEL
Chol/HDL Ratio: 4.1 ratio (ref 0.0–5.0)
Cholesterol, Total: 160 mg/dL (ref 100–199)
HDL: 39 mg/dL — ABNORMAL LOW (ref >39)
LDL Chol Calc (NIH): 108 mg/dL — ABNORMAL HIGH (ref 0–99)
Triglycerides: 68 mg/dL (ref 0–149)
VLDL Cholesterol Cal: 13 mg/dL (ref 5–40)

## 2022-08-04 LAB — CBC WITH DIFFERENTIAL/PLATELET
Basophils Absolute: 0.1 10*3/uL (ref 0.0–0.2)
Basos: 1 %
EOS (ABSOLUTE): 0.3 10*3/uL (ref 0.0–0.4)
Eos: 4 %
Hematocrit: 43.7 % (ref 37.5–51.0)
Hemoglobin: 14.4 g/dL (ref 13.0–17.7)
Immature Grans (Abs): 0 10*3/uL (ref 0.0–0.1)
Immature Granulocytes: 1 %
Lymphocytes Absolute: 2.3 10*3/uL (ref 0.7–3.1)
Lymphs: 28 %
MCH: 30.3 pg (ref 26.6–33.0)
MCHC: 33 g/dL (ref 31.5–35.7)
MCV: 92 fL (ref 79–97)
Monocytes Absolute: 0.7 10*3/uL (ref 0.1–0.9)
Monocytes: 8 %
Neutrophils Absolute: 5.1 10*3/uL (ref 1.4–7.0)
Neutrophils: 58 %
Platelets: 231 10*3/uL (ref 150–450)
RBC: 4.75 x10E6/uL (ref 4.14–5.80)
RDW: 12.5 % (ref 11.6–15.4)
WBC: 8.5 10*3/uL (ref 3.4–10.8)

## 2022-08-04 LAB — CMP14+EGFR
ALT: 16 IU/L (ref 0–44)
AST: 22 IU/L (ref 0–40)
Albumin/Globulin Ratio: 1.6 (ref 1.2–2.2)
Albumin: 4.3 g/dL (ref 3.9–4.9)
Alkaline Phosphatase: 161 IU/L — ABNORMAL HIGH (ref 44–121)
BUN/Creatinine Ratio: 18 (ref 10–24)
BUN: 15 mg/dL (ref 8–27)
Bilirubin Total: 0.4 mg/dL (ref 0.0–1.2)
CO2: 24 mmol/L (ref 20–29)
Calcium: 9.7 mg/dL (ref 8.6–10.2)
Chloride: 103 mmol/L (ref 96–106)
Creatinine, Ser: 0.85 mg/dL (ref 0.76–1.27)
Globulin, Total: 2.7 g/dL (ref 1.5–4.5)
Glucose: 96 mg/dL (ref 70–99)
Potassium: 4.1 mmol/L (ref 3.5–5.2)
Sodium: 141 mmol/L (ref 134–144)
Total Protein: 7 g/dL (ref 6.0–8.5)
eGFR: 95 mL/min/{1.73_m2} (ref >59)

## 2022-08-06 NOTE — Progress Notes (Signed)
Hello Ashan,  Your lab result is normal and/or stable.Some minor variations that are not significant are commonly marked abnormal, but do not represent any medical problem for you.  Best regards, Claretta Fraise, M.D.

## 2022-08-08 LAB — TOXASSURE SELECT 13 (MW), URINE

## 2022-10-06 ENCOUNTER — Other Ambulatory Visit: Payer: Self-pay | Admitting: Family Medicine

## 2022-10-06 DIAGNOSIS — K219 Gastro-esophageal reflux disease without esophagitis: Secondary | ICD-10-CM

## 2022-10-06 DIAGNOSIS — I10 Essential (primary) hypertension: Secondary | ICD-10-CM

## 2022-10-17 ENCOUNTER — Telehealth: Payer: Self-pay | Admitting: Family Medicine

## 2022-10-17 NOTE — Telephone Encounter (Signed)
When the med was changed, there were likely still refills on the old prescription. Those do not expire for a year. T is always a good idea to check your medications at the pharmacy before you leave. Remember, refills are given for your convenience and are not cancelled when the medication is discontinued.

## 2022-10-18 ENCOUNTER — Other Ambulatory Visit: Payer: Self-pay | Admitting: *Deleted

## 2022-10-18 NOTE — Telephone Encounter (Signed)
PATIENT AWARE

## 2023-01-31 ENCOUNTER — Other Ambulatory Visit: Payer: Self-pay | Admitting: Family Medicine

## 2023-01-31 ENCOUNTER — Ambulatory Visit (INDEPENDENT_AMBULATORY_CARE_PROVIDER_SITE_OTHER): Payer: Medicare Other | Admitting: Family Medicine

## 2023-01-31 ENCOUNTER — Encounter: Payer: Self-pay | Admitting: Family Medicine

## 2023-01-31 VITALS — BP 140/77 | HR 94 | Temp 98.1°F | Ht 71.0 in | Wt 205.2 lb

## 2023-01-31 DIAGNOSIS — E7800 Pure hypercholesterolemia, unspecified: Secondary | ICD-10-CM

## 2023-01-31 DIAGNOSIS — K219 Gastro-esophageal reflux disease without esophagitis: Secondary | ICD-10-CM

## 2023-01-31 DIAGNOSIS — I10 Essential (primary) hypertension: Secondary | ICD-10-CM | POA: Diagnosis not present

## 2023-01-31 DIAGNOSIS — F419 Anxiety disorder, unspecified: Secondary | ICD-10-CM | POA: Diagnosis not present

## 2023-01-31 DIAGNOSIS — R7301 Impaired fasting glucose: Secondary | ICD-10-CM

## 2023-01-31 LAB — BAYER DCA HB A1C WAIVED: HB A1C (BAYER DCA - WAIVED): 5.9 % — ABNORMAL HIGH (ref 4.8–5.6)

## 2023-01-31 MED ORDER — ALPRAZOLAM 1 MG PO TABS: 1.0000 mg | ORAL_TABLET | Freq: Three times a day (TID) | ORAL | 2 refills | Status: AC | PRN

## 2023-01-31 MED ORDER — PANTOPRAZOLE SODIUM 20 MG PO TBEC: 20.0000 mg | DELAYED_RELEASE_TABLET | Freq: Every day | ORAL | 3 refills | Status: AC

## 2023-01-31 NOTE — Progress Notes (Signed)
Subjective:  Patient ID: Mark Sharp, male    DOB: 1955/12/23  Age: 67 y.o. MRN: [id number]  CC: Medical Management of Chronic Issues   HPI Mark Sharp presents for  follow-up of hypertension. Patient has no history of headache chest pain or shortness of breath or recent cough. Patient also denies symptoms of TIA such as focal numbness or weakness. Patient denies side effects from medication. States taking it regularly. Home readings 130/73 or less.    in for follow-up of elevated cholesterol. Doing well without complaints on current medication. Denies side effects of statin including myalgia and arthralgia and nausea. Currently no chest pain, shortness of breath or other cardiovascular related symptoms noted.  Elevated glucose previously. In for recheck for DM.  Patient in for follow-up of GERD. Currently asymptomatic taking  PPI daily. There is no chest pain or heartburn. No hematemesis and no melena. No dysphagia or choking. Onset is remote. Progression is stable. Complicating factors, none.     01/31/2023   11:46 AM 08/03/2022    2:25 PM 02/23/2021   11:15 AM 01/10/2021    8:57 AM  GAD 7 : Generalized Anxiety Score  Nervous, Anxious, on Edge 1 0 1 1  Control/stop worrying 0 0 0 0  Worry too much - different things 0 0 0 0  Trouble relaxing 0 0 0 0  Restless 0 0 0 0  Easily annoyed or irritable 0 0 0 0  Afraid - awful might happen 0 0 0 0  Total GAD 7 Score 1 0 1 1  Anxiety Difficulty Not difficult at all Not difficult at all Not difficult at all Somewhat difficult     History Mark Sharp has a past medical history of Anxiety, Depression, GERD (gastroesophageal reflux disease), Glaucoma, Hyperlipidemia, and Hypertension.   Mark Sharp has a past surgical history that includes Eye surgery and Spine surgery.   His family history includes Arthritis in his mother; Cancer in his mother; Diabetes in his mother; Endometrial cancer in his mother.Mark Sharp reports that Mark Sharp quit smoking about 34 years ago.  His smoking use included cigarettes. Mark Sharp has never used smokeless tobacco. Mark Sharp reports that Mark Sharp does not currently use alcohol. Mark Sharp reports that Mark Sharp does not use drugs.  Current Outpatient Medications on File Prior to Visit  Medication Sig Dispense Refill   citalopram (CELEXA) 20 MG tablet Take 1 tablet (20 mg total) by mouth daily. 90 tablet 3   latanoprost (XALATAN) 0.005 % ophthalmic solution      lisinopril-hydrochlorothiazide (ZESTORETIC) 10-12.5 MG tablet Take 1 tablet by mouth daily. For blood pressure 90 tablet 3   pravastatin (PRAVACHOL) 40 MG tablet Take 1 tablet (40 mg total) by mouth daily. 90 tablet 3   No current facility-administered medications on file prior to visit.    ROS Review of Systems  Constitutional:  Negative for fever.  Respiratory:  Negative for shortness of breath.   Cardiovascular:  Negative for chest pain.  Musculoskeletal:  Negative for arthralgias.  Skin:  Negative for rash.    Objective:  BP (!) 140/77   Pulse 94   Temp 98.1 F (36.7 C)   Ht 5\' 11"  (1.803 m)   Wt 205 lb 3.2 oz (93.1 kg)   SpO2 95%   BMI 28.62 kg/m   BP Readings from Last 3 Encounters:  01/31/23 (!) 140/77  08/03/22 (!) 150/82  03/27/22 138/70    Wt Readings from Last 3 Encounters:  01/31/23 205 lb 3.2 oz (93.1 kg)  08/03/22  228 lb 6.4 oz (103.6 kg)  03/27/22 242 lb (109.8 kg)     Physical Exam Vitals reviewed.  Constitutional:      Appearance: Mark Sharp is well-developed.  HENT:     Head: Normocephalic and atraumatic.     Right Ear: External ear normal.     Left Ear: External ear normal.     Mouth/Throat:     Pharynx: No oropharyngeal exudate or posterior oropharyngeal erythema.  Eyes:     Pupils: Pupils are equal, round, and reactive to light.  Cardiovascular:     Rate and Rhythm: Normal rate and regular rhythm.     Heart sounds: No murmur heard. Pulmonary:     Effort: No respiratory distress.     Breath sounds: Normal breath sounds.  Musculoskeletal:     Cervical  back: Normal range of motion and neck supple.  Neurological:     Mental Status: Mark Sharp is alert and oriented to person, place, and time.       Assessment & Plan:   Mark Sharp was seen today for medical management of chronic issues.  Diagnoses and all orders for this visit:  Essential hypertension -     CBC with Differential/Platelet -     CMP14+EGFR  Pure hypercholesterolemia -     Lipid panel  Elevated fasting glucose -     Bayer DCA Hb A1c Waived  Anxiety -     ALPRAZolam (XANAX) 1 MG tablet; Take 1 tablet (1 mg total) by mouth 3 (three) times daily as needed for anxiety.  Gastroesophageal reflux disease without esophagitis -     pantoprazole (PROTONIX) 20 MG tablet; Take 1 tablet (20 mg total) by mouth daily.   Allergies as of 01/31/2023       Reactions   Atorvastatin Other (See Comments)   Rosuvastatin Calcium Other (See Comments)   Valsartan Other (See Comments)   Penicillins Rash        Medication List        Accurate as of January 31, 2023  6:18 PM. If you have any questions, ask your nurse or doctor.          ALPRAZolam 1 MG tablet Commonly known as: Xanax Take 1 tablet (1 mg total) by mouth 3 (three) times daily as needed for anxiety.   citalopram 20 MG tablet Commonly known as: CELEXA Take 1 tablet (20 mg total) by mouth daily.   latanoprost 0.005 % ophthalmic solution Commonly known as: XALATAN   lisinopril-hydrochlorothiazide 10-12.5 MG tablet Commonly known as: ZESTORETIC Take 1 tablet by mouth daily. For blood pressure   pantoprazole 20 MG tablet Commonly known as: PROTONIX Take 1 tablet (20 mg total) by mouth daily.   pravastatin 40 MG tablet Commonly known as: PRAVACHOL Take 1 tablet (40 mg total) by mouth daily.        Meds ordered this encounter  Medications   ALPRAZolam (XANAX) 1 MG tablet    Sig: Take 1 tablet (1 mg total) by mouth 3 (three) times daily as needed for anxiety.    Dispense:  90 tablet    Refill:  2    pantoprazole (PROTONIX) 20 MG tablet    Sig: Take 1 tablet (20 mg total) by mouth daily.    Dispense:  100 each    Refill:  3      Follow-up: Return in about 6 months (around 08/03/2023).  Mechele Claude, M.D.

## 2023-02-01 LAB — LIPID PANEL
Chol/HDL Ratio: 3.3 ratio (ref 0.0–5.0)
Cholesterol, Total: 144 mg/dL (ref 100–199)
HDL: 44 mg/dL (ref >39)
LDL Chol Calc (NIH): 89 mg/dL (ref 0–99)
Triglycerides: 52 mg/dL (ref 0–149)
VLDL Cholesterol Cal: 11 mg/dL (ref 5–40)

## 2023-02-01 LAB — CMP14+EGFR
ALT: 14 IU/L (ref 0–44)
AST: 24 IU/L (ref 0–40)
Albumin: 4.1 g/dL (ref 3.9–4.9)
Alkaline Phosphatase: 146 IU/L — ABNORMAL HIGH (ref 44–121)
BUN/Creatinine Ratio: 25 — ABNORMAL HIGH (ref 10–24)
BUN: 19 mg/dL (ref 8–27)
Bilirubin Total: 0.3 mg/dL (ref 0.0–1.2)
CO2: 26 mmol/L (ref 20–29)
Calcium: 9.4 mg/dL (ref 8.6–10.2)
Chloride: 107 mmol/L — ABNORMAL HIGH (ref 96–106)
Creatinine, Ser: 0.77 mg/dL (ref 0.76–1.27)
Globulin, Total: 2.6 g/dL (ref 1.5–4.5)
Glucose: 107 mg/dL — ABNORMAL HIGH (ref 70–99)
Potassium: 3.9 mmol/L (ref 3.5–5.2)
Sodium: 144 mmol/L (ref 134–144)
Total Protein: 6.7 g/dL (ref 6.0–8.5)
eGFR: 98 mL/min/{1.73_m2} (ref >59)

## 2023-02-01 LAB — CBC WITH DIFFERENTIAL/PLATELET
Basophils Absolute: 0 10*3/uL (ref 0.0–0.2)
Basos: 1 %
EOS (ABSOLUTE): 0.4 10*3/uL (ref 0.0–0.4)
Eos: 6 %
Hematocrit: 42.5 % (ref 37.5–51.0)
Hemoglobin: 14.1 g/dL (ref 13.0–17.7)
Immature Grans (Abs): 0 10*3/uL (ref 0.0–0.1)
Immature Granulocytes: 0 %
Lymphocytes Absolute: 1.9 10*3/uL (ref 0.7–3.1)
Lymphs: 29 %
MCH: 30.5 pg (ref 26.6–33.0)
MCHC: 33.2 g/dL (ref 31.5–35.7)
MCV: 92 fL (ref 79–97)
Monocytes Absolute: 0.6 10*3/uL (ref 0.1–0.9)
Monocytes: 9 %
Neutrophils Absolute: 3.6 10*3/uL (ref 1.4–7.0)
Neutrophils: 55 %
Platelets: 218 10*3/uL (ref 150–450)
RBC: 4.63 x10E6/uL (ref 4.14–5.80)
RDW: 13.1 % (ref 11.6–15.4)
WBC: 6.5 10*3/uL (ref 3.4–10.8)

## 2023-02-05 NOTE — Progress Notes (Signed)
Hello Airik,  Your lab result is normal and/or stable.Some minor variations that are not significant are commonly marked abnormal, but do not represent any medical problem for you.  Best regards, Carleton [place], M.D.

## 2023-02-26 DIAGNOSIS — H402212 Chronic angle-closure glaucoma, right eye, moderate stage: Secondary | ICD-10-CM | POA: Diagnosis not present

## 2023-06-26 ENCOUNTER — Other Ambulatory Visit: Payer: Self-pay | Admitting: Family Medicine

## 2023-07-24 ENCOUNTER — Other Ambulatory Visit: Payer: Self-pay | Admitting: Family Medicine

## 2023-08-06 ENCOUNTER — Ambulatory Visit: Payer: Medicare Other | Admitting: Family Medicine

## 2023-09-26 ENCOUNTER — Other Ambulatory Visit: Payer: Self-pay | Admitting: Family Medicine

## 2023-09-26 DIAGNOSIS — E7800 Pure hypercholesterolemia, unspecified: Secondary | ICD-10-CM

## 2023-10-07 ENCOUNTER — Other Ambulatory Visit: Payer: Self-pay | Admitting: Family Medicine

## 2023-10-07 DIAGNOSIS — F419 Anxiety disorder, unspecified: Secondary | ICD-10-CM

## 2024-01-01 ENCOUNTER — Ambulatory Visit: Admitting: Family Medicine

## 2024-01-03 DIAGNOSIS — Z79899 Other long term (current) drug therapy: Secondary | ICD-10-CM | POA: Diagnosis not present

## 2024-01-03 DIAGNOSIS — Z7689 Persons encountering health services in other specified circumstances: Secondary | ICD-10-CM | POA: Diagnosis not present

## 2024-01-03 DIAGNOSIS — R7301 Impaired fasting glucose: Secondary | ICD-10-CM | POA: Diagnosis not present

## 2024-01-03 DIAGNOSIS — E782 Mixed hyperlipidemia: Secondary | ICD-10-CM | POA: Diagnosis not present

## 2024-01-03 DIAGNOSIS — I10 Essential (primary) hypertension: Secondary | ICD-10-CM | POA: Diagnosis not present

## 2024-01-05 ENCOUNTER — Other Ambulatory Visit: Payer: Self-pay | Admitting: Family Medicine

## 2024-01-05 DIAGNOSIS — F419 Anxiety disorder, unspecified: Secondary | ICD-10-CM

## 2024-01-30 DIAGNOSIS — I10 Essential (primary) hypertension: Secondary | ICD-10-CM | POA: Diagnosis not present

## 2024-01-30 DIAGNOSIS — R7301 Impaired fasting glucose: Secondary | ICD-10-CM | POA: Diagnosis not present

## 2024-02-06 DIAGNOSIS — Z8669 Personal history of other diseases of the nervous system and sense organs: Secondary | ICD-10-CM | POA: Diagnosis not present

## 2024-02-06 DIAGNOSIS — Z87891 Personal history of nicotine dependence: Secondary | ICD-10-CM | POA: Diagnosis not present

## 2024-02-06 DIAGNOSIS — E782 Mixed hyperlipidemia: Secondary | ICD-10-CM | POA: Diagnosis not present

## 2024-02-06 DIAGNOSIS — I10 Essential (primary) hypertension: Secondary | ICD-10-CM | POA: Diagnosis not present

## 2024-02-06 DIAGNOSIS — R7301 Impaired fasting glucose: Secondary | ICD-10-CM | POA: Diagnosis not present

## 2024-02-07 ENCOUNTER — Encounter (INDEPENDENT_AMBULATORY_CARE_PROVIDER_SITE_OTHER): Payer: Self-pay | Admitting: *Deleted

## 2024-02-28 DIAGNOSIS — H402212 Chronic angle-closure glaucoma, right eye, moderate stage: Secondary | ICD-10-CM | POA: Diagnosis not present
